# Patient Record
Sex: Female | Born: 1995 | Race: Black or African American | Hispanic: No | Marital: Single | Smoking: Current every day smoker
Health system: Southern US, Community
[De-identification: ages and names within clinical notes are randomized; demographics above are authoritative.]

## PROBLEM LIST (undated history)

## (undated) ENCOUNTER — Inpatient Hospital Stay (HOSPITAL_COMMUNITY): Payer: Self-pay

## (undated) ENCOUNTER — Emergency Department (HOSPITAL_COMMUNITY)

## (undated) DIAGNOSIS — Z789 Other specified health status: Secondary | ICD-10-CM

## (undated) DIAGNOSIS — L509 Urticaria, unspecified: Secondary | ICD-10-CM

## (undated) DIAGNOSIS — R519 Headache, unspecified: Secondary | ICD-10-CM

## (undated) DIAGNOSIS — R51 Headache: Secondary | ICD-10-CM

## (undated) HISTORY — DX: Urticaria, unspecified: L50.9

## (undated) HISTORY — PX: NO PAST SURGERIES: SHX2092

---
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
## 2014-02-06 ENCOUNTER — Ambulatory Visit (INDEPENDENT_AMBULATORY_CARE_PROVIDER_SITE_OTHER): Payer: Medicaid Other | Admitting: Obstetrics & Gynecology

## 2014-02-06 VITALS — BP 99/80 | HR 90 | Temp 98.5°F | Wt 158.0 lb

## 2014-02-06 DIAGNOSIS — IMO0002 Reserved for concepts with insufficient information to code with codable children: Secondary | ICD-10-CM

## 2014-02-06 LAB — POCT URINALYSIS DIP (DEVICE)
BILIRUBIN URINE: NEGATIVE
GLUCOSE, UA: NEGATIVE mg/dL
Ketones, ur: NEGATIVE mg/dL
NITRITE: NEGATIVE
Protein, ur: NEGATIVE mg/dL
Specific Gravity, Urine: 1.02 (ref 1.005–1.030)
UROBILINOGEN UA: 1 mg/dL (ref 0.0–1.0)
pH: 7 (ref 5.0–8.0)

## 2014-02-06 LAB — CBC
HCT: 29.6 % — ABNORMAL LOW (ref 36.0–46.0)
HEMOGLOBIN: 10.3 g/dL — AB (ref 12.0–15.0)
MCH: 30 pg (ref 26.0–34.0)
MCHC: 34.8 g/dL (ref 30.0–36.0)
MCV: 86.3 fL (ref 78.0–100.0)
PLATELETS: 244 10*3/uL (ref 150–400)
RBC: 3.43 MIL/uL — AB (ref 3.87–5.11)
RDW: 13.9 % (ref 11.5–15.5)
WBC: 8 10*3/uL (ref 4.0–10.5)

## 2014-02-06 LAB — RPR

## 2014-02-06 NOTE — Addendum Note (Signed)
Addended by: Sherre LainASH, Winifred Bodiford A on: 02/06/2014 12:30 PM   Modules accepted: Orders

## 2014-02-06 NOTE — Progress Notes (Signed)
Edema trace in feet. 1 hour GCT today.

## 2014-02-06 NOTE — Progress Notes (Signed)
No complaints, 1 hr GTT

## 2014-02-07 LAB — GLUCOSE TOLERANCE, 1 HOUR (50G) W/O FASTING: Glucose, 1 Hour GTT: 112 mg/dL (ref 70–140)

## 2014-02-07 LAB — HIV ANTIBODY (ROUTINE TESTING W REFLEX): HIV: NONREACTIVE

## 2014-02-27 ENCOUNTER — Ambulatory Visit (INDEPENDENT_AMBULATORY_CARE_PROVIDER_SITE_OTHER): Payer: Medicaid Other | Admitting: Physician Assistant

## 2014-02-27 VITALS — BP 117/98 | HR 83 | Temp 98.1°F | Wt 162.8 lb

## 2014-02-27 DIAGNOSIS — IMO0002 Reserved for concepts with insufficient information to code with codable children: Secondary | ICD-10-CM

## 2014-02-27 LAB — POCT URINALYSIS DIP (DEVICE)
BILIRUBIN URINE: NEGATIVE
GLUCOSE, UA: NEGATIVE mg/dL
HGB URINE DIPSTICK: NEGATIVE
Ketones, ur: NEGATIVE mg/dL
NITRITE: NEGATIVE
PH: 7 (ref 5.0–8.0)
Protein, ur: NEGATIVE mg/dL
Specific Gravity, Urine: 1.01 (ref 1.005–1.030)
UROBILINOGEN UA: 1 mg/dL (ref 0.0–1.0)

## 2014-02-27 NOTE — Progress Notes (Signed)
Edema-ankles  Pressure-lower abd

## 2014-02-27 NOTE — Progress Notes (Signed)
32 weeks, BP rechecked during visit and found to be 118/67 Cont PNV Tdap next visit.  Info given today.   Awaiting urine culture for large leukocytes in urine. RTC 2 weeks

## 2014-02-27 NOTE — Patient Instructions (Signed)
Third Trimester of Pregnancy The third trimester is from week 29 through week 42, months 7 through 9. The third trimester is a time when the fetus is growing rapidly. At the end of the ninth month, the fetus is about 20 inches in length and weighs 6-10 pounds.  BODY CHANGES Your body goes through many changes during pregnancy. The changes vary from woman to woman.   Your weight will continue to increase. You can expect to gain 25-35 pounds (11-16 kg) by the end of the pregnancy.  You may begin to get stretch marks on your hips, abdomen, and breasts.  You may urinate more often because the fetus is moving lower into your pelvis and pressing on your bladder.  You may develop or continue to have heartburn as a result of your pregnancy.  You may develop constipation because certain hormones are causing the muscles that push waste through your intestines to slow down.  You may develop hemorrhoids or swollen, bulging veins (varicose veins).  You may have pelvic pain because of the weight gain and pregnancy hormones relaxing your joints between the bones in your pelvis. Backaches may result from overexertion of the muscles supporting your posture.  You may have changes in your hair. These can include thickening of your hair, rapid growth, and changes in texture. Some women also have hair loss during or after pregnancy, or hair that feels dry or thin. Your hair will most likely return to normal after your baby is born.  Your breasts will continue to grow and be tender. A yellow discharge may leak from your breasts called colostrum.  Your belly button may stick out.  You may feel short of breath because of your expanding uterus.  You may notice the fetus "dropping," or moving lower in your abdomen.  You may have a bloody mucus discharge. This usually occurs a few days to a week before labor begins.  Your cervix becomes thin and soft (effaced) near your due date. WHAT TO EXPECT AT YOUR PRENATAL  EXAMS  You will have prenatal exams every 2 weeks until week 36. Then, you will have weekly prenatal exams. During a routine prenatal visit:  You will be weighed to make sure you and the fetus are growing normally.  Your blood pressure is taken.  Your abdomen will be measured to track your baby's growth.  The fetal heartbeat will be listened to.  Any test results from the previous visit will be discussed.  You may have a cervical check near your due date to see if you have effaced. At around 36 weeks, your caregiver will check your cervix. At the same time, your caregiver will also perform a test on the secretions of the vaginal tissue. This test is to determine if a type of bacteria, Group B streptococcus, is present. Your caregiver will explain this further. Your caregiver may ask you:  What your birth plan is.  How you are feeling.  If you are feeling the baby move.  If you have had any abnormal symptoms, such as leaking fluid, bleeding, severe headaches, or abdominal cramping.  If you have any questions. Other tests or screenings that may be performed during your third trimester include:  Blood tests that check for low iron levels (anemia).  Fetal testing to check the health, activity level, and growth of the fetus. Testing is done if you have certain medical conditions or if there are problems during the pregnancy. FALSE LABOR You may feel small, irregular contractions that   eventually go away. These are called Braxton Hicks contractions, or false labor. Contractions may last for hours, days, or even weeks before true labor sets in. If contractions come at regular intervals, intensify, or become painful, it is best to be seen by your caregiver.  SIGNS OF LABOR   Menstrual-like cramps.  Contractions that are 5 minutes apart or less.  Contractions that start on the top of the uterus and spread down to the lower abdomen and back.  A sense of increased pelvic pressure or back  pain.  A watery or bloody mucus discharge that comes from the vagina. If you have any of these signs before the 37th week of pregnancy, call your caregiver right away. You need to go to the hospital to get checked immediately. HOME CARE INSTRUCTIONS   Avoid all smoking, herbs, alcohol, and unprescribed drugs. These chemicals affect the formation and growth of the baby.  Follow your caregiver's instructions regarding medicine use. There are medicines that are either safe or unsafe to take during pregnancy.  Exercise only as directed by your caregiver. Experiencing uterine cramps is a good sign to stop exercising.  Continue to eat regular, healthy meals.  Wear a good support bra for breast tenderness.  Do not use hot tubs, steam rooms, or saunas.  Wear your seat belt at all times when driving.  Avoid raw meat, uncooked cheese, cat litter boxes, and soil used by cats. These carry germs that can cause birth defects in the baby.  Take your prenatal vitamins.  Try taking a stool softener (if your caregiver approves) if you develop constipation. Eat more high-fiber foods, such as fresh vegetables or fruit and whole grains. Drink plenty of fluids to keep your urine clear or pale yellow.  Take warm sitz baths to soothe any pain or discomfort caused by hemorrhoids. Use hemorrhoid cream if your caregiver approves.  If you develop varicose veins, wear support hose. Elevate your feet for 15 minutes, 3-4 times a day. Limit salt in your diet.  Avoid heavy lifting, wear low heal shoes, and practice good posture.  Rest a lot with your legs elevated if you have leg cramps or low back pain.  Visit your dentist if you have not gone during your pregnancy. Use a soft toothbrush to brush your teeth and be gentle when you floss.  A sexual relationship may be continued unless your caregiver directs you otherwise.  Do not travel far distances unless it is absolutely necessary and only with the approval  of your caregiver.  Take prenatal classes to understand, practice, and ask questions about the labor and delivery.  Make a trial run to the hospital.  Pack your hospital bag.  Prepare the baby's nursery.  Continue to go to all your prenatal visits as directed by your caregiver. SEEK MEDICAL CARE IF:  You are unsure if you are in labor or if your water has broken.  You have dizziness.  You have mild pelvic cramps, pelvic pressure, or nagging pain in your abdominal area.  You have persistent nausea, vomiting, or diarrhea.  You have a bad smelling vaginal discharge.  You have pain with urination. SEEK IMMEDIATE MEDICAL CARE IF:   You have a fever.  You are leaking fluid from your vagina.  You have spotting or bleeding from your vagina.  You have severe abdominal cramping or pain.  You have rapid weight loss or gain.  You have shortness of breath with chest pain.  You notice sudden or extreme swelling   of your face, hands, ankles, feet, or legs.  You have not felt your baby move in over an hour.  You have severe headaches that do not go away with medicine.  You have vision changes. Document Released: 06/20/2001 Document Revised: 07/01/2013 Document Reviewed: 08/27/2012 Exit[employer] Patient Information 2015 [employer]. This information is not intended to replace advice given to you by your health care provider. Make sure you discuss any questions you have with your health care provider.  

## 2014-03-01 LAB — URINE CULTURE: Colony Count: >=100000

## 2014-03-20 ENCOUNTER — Ambulatory Visit (INDEPENDENT_AMBULATORY_CARE_PROVIDER_SITE_OTHER): Payer: Medicaid Other | Admitting: Family Medicine

## 2014-03-20 VITALS — BP 127/73 | HR 92 | Temp 98.3°F | Wt 162.6 lb

## 2014-03-20 DIAGNOSIS — Z23 Encounter for immunization: Secondary | ICD-10-CM

## 2014-03-20 DIAGNOSIS — Z3480 Encounter for supervision of other normal pregnancy, unspecified trimester: Secondary | ICD-10-CM

## 2014-03-20 DIAGNOSIS — IMO0002 Reserved for concepts with insufficient information to code with codable children: Secondary | ICD-10-CM

## 2014-03-20 LAB — POCT URINALYSIS DIP (DEVICE)
BILIRUBIN URINE: NEGATIVE
Glucose, UA: NEGATIVE mg/dL
KETONES UR: NEGATIVE mg/dL
Nitrite: NEGATIVE
PH: 7.5 (ref 5.0–8.0)
PROTEIN: NEGATIVE mg/dL
Specific Gravity, Urine: 1.015 (ref 1.005–1.030)
Urobilinogen, UA: 2 mg/dL — ABNORMAL HIGH (ref 0.0–1.0)

## 2014-03-20 MED ORDER — TETANUS-DIPHTH-ACELL PERTUSSIS 5-2.5-18.5 LF-MCG/0.5 IM SUSP: 0.5000 mL | Freq: Once | INTRAMUSCULAR | Status: DC

## 2014-03-20 NOTE — Patient Instructions (Signed)
Third Trimester of Pregnancy The third trimester is from week 29 through week 42, months 7 through 9. The third trimester is a time when the fetus is growing rapidly. At the end of the ninth month, the fetus is about 20 inches in length and weighs 6-10 pounds.  BODY CHANGES Your body goes through many changes during pregnancy. The changes vary from woman to woman.   Your weight will continue to increase. You can expect to gain 25-35 pounds (11-16 kg) by the end of the pregnancy.  You may begin to get stretch marks on your hips, abdomen, and breasts.  You may urinate more often because the fetus is moving lower into your pelvis and pressing on your bladder.  You may develop or continue to have heartburn as a result of your pregnancy.  You may develop constipation because certain hormones are causing the muscles that push waste through your intestines to slow down.  You may develop hemorrhoids or swollen, bulging veins (varicose veins).  You may have pelvic pain because of the weight gain and pregnancy hormones relaxing your joints between the bones in your pelvis. Backaches may result from overexertion of the muscles supporting your posture.  You may have changes in your hair. These can include thickening of your hair, rapid growth, and changes in texture. Some women also have hair loss during or after pregnancy, or hair that feels dry or thin. Your hair will most likely return to normal after your baby is born.  Your breasts will continue to grow and be tender. A yellow discharge may leak from your breasts called colostrum.  Your belly button may stick out.  You may feel short of breath because of your expanding uterus.  You may notice the fetus "dropping," or moving lower in your abdomen.  You may have a bloody mucus discharge. This usually occurs a few days to a week before labor begins.  Your cervix becomes thin and soft (effaced) near your due date. WHAT TO EXPECT AT YOUR PRENATAL  EXAMS  You will have prenatal exams every 2 weeks until week 36. Then, you will have weekly prenatal exams. During a routine prenatal visit:  You will be weighed to make sure you and the fetus are growing normally.  Your blood pressure is taken.  Your abdomen will be measured to track your baby's growth.  The fetal heartbeat will be listened to.  Any test results from the previous visit will be discussed.  You may have a cervical check near your due date to see if you have effaced. At around 36 weeks, your caregiver will check your cervix. At the same time, your caregiver will also perform a test on the secretions of the vaginal tissue. This test is to determine if a type of bacteria, Group B streptococcus, is present. Your caregiver will explain this further. Your caregiver may ask you:  What your birth plan is.  How you are feeling.  If you are feeling the baby move.  If you have had any abnormal symptoms, such as leaking fluid, bleeding, severe headaches, or abdominal cramping.  If you have any questions. Other tests or screenings that may be performed during your third trimester include:  Blood tests that check for low iron levels (anemia).  Fetal testing to check the health, activity level, and growth of the fetus. Testing is done if you have certain medical conditions or if there are problems during the pregnancy. FALSE LABOR You may feel small, irregular contractions that   eventually go away. These are called Braxton Hicks contractions, or false labor. Contractions may last for hours, days, or even weeks before true labor sets in. If contractions come at regular intervals, intensify, or become painful, it is best to be seen by your caregiver.  SIGNS OF LABOR   Menstrual-like cramps.  Contractions that are 5 minutes apart or less.  Contractions that start on the top of the uterus and spread down to the lower abdomen and back.  A sense of increased pelvic pressure or back  pain.  A watery or bloody mucus discharge that comes from the vagina. If you have any of these signs before the 37th week of pregnancy, call your caregiver right away. You need to go to the hospital to get checked immediately. HOME CARE INSTRUCTIONS   Avoid all smoking, herbs, alcohol, and unprescribed drugs. These chemicals affect the formation and growth of the baby.  Follow your caregiver's instructions regarding medicine use. There are medicines that are either safe or unsafe to take during pregnancy.  Exercise only as directed by your caregiver. Experiencing uterine cramps is a good sign to stop exercising.  Continue to eat regular, healthy meals.  Wear a good support bra for breast tenderness.  Do not use hot tubs, steam rooms, or saunas.  Wear your seat belt at all times when driving.  Avoid raw meat, uncooked cheese, cat litter boxes, and soil used by cats. These carry germs that can cause birth defects in the baby.  Take your prenatal vitamins.  Try taking a stool softener (if your caregiver approves) if you develop constipation. Eat more high-fiber foods, such as fresh vegetables or fruit and whole grains. Drink plenty of fluids to keep your urine clear or pale yellow.  Take warm sitz baths to soothe any pain or discomfort caused by hemorrhoids. Use hemorrhoid cream if your caregiver approves.  If you develop varicose veins, wear support hose. Elevate your feet for 15 minutes, 3-4 times a day. Limit salt in your diet.  Avoid heavy lifting, wear low heal shoes, and practice good posture.  Rest a lot with your legs elevated if you have leg cramps or low back pain.  Visit your dentist if you have not gone during your pregnancy. Use a soft toothbrush to brush your teeth and be gentle when you floss.  A sexual relationship may be continued unless your caregiver directs you otherwise.  Do not travel far distances unless it is absolutely necessary and only with the approval  of your caregiver.  Take prenatal classes to understand, practice, and ask questions about the labor and delivery.  Make a trial run to the hospital.  Pack your hospital bag.  Prepare the baby's nursery.  Continue to go to all your prenatal visits as directed by your caregiver. SEEK MEDICAL CARE IF:  You are unsure if you are in labor or if your water has broken.  You have dizziness.  You have mild pelvic cramps, pelvic pressure, or nagging pain in your abdominal area.  You have persistent nausea, vomiting, or diarrhea.  You have a bad smelling vaginal discharge.  You have pain with urination. SEEK IMMEDIATE MEDICAL CARE IF:   You have a fever.  You are leaking fluid from your vagina.  You have spotting or bleeding from your vagina.  You have severe abdominal cramping or pain.  You have rapid weight loss or gain.  You have shortness of breath with chest pain.  You notice sudden or extreme swelling   of your face, hands, ankles, feet, or legs.  You have not felt your baby move in over an hour.  You have severe headaches that do not go away with medicine.  You have vision changes. Document Released: 06/20/2001 Document Revised: 07/01/2013 Document Reviewed: 08/27/2012 Exit[employer] Patient Information 2015 [employer]. This information is not intended to replace advice given to you by your health care provider. Make sure you discuss any questions you have with your health care provider.  

## 2014-03-20 NOTE — Progress Notes (Signed)
Patient without complaints.  Denies vaginal bleeding, abnormal vaginal discharge, contractions, loss of fluid.  Reports good fetal activity.  Follow up in 1 weeks.

## 2014-03-30 ENCOUNTER — Encounter: Payer: Self-pay | Admitting: Obstetrics and Gynecology

## 2014-03-30 ENCOUNTER — Other Ambulatory Visit: Payer: Self-pay | Admitting: Obstetrics and Gynecology

## 2014-03-30 ENCOUNTER — Ambulatory Visit (INDEPENDENT_AMBULATORY_CARE_PROVIDER_SITE_OTHER): Payer: Medicaid Other | Admitting: Obstetrics and Gynecology

## 2014-03-30 VITALS — BP 135/71 | HR 78 | Temp 97.9°F | Wt 166.6 lb

## 2014-03-30 DIAGNOSIS — IMO0002 Reserved for concepts with insufficient information to code with codable children: Secondary | ICD-10-CM

## 2014-03-30 DIAGNOSIS — Z3400 Encounter for supervision of normal first pregnancy, unspecified trimester: Secondary | ICD-10-CM

## 2014-03-30 DIAGNOSIS — A749 Chlamydial infection, unspecified: Secondary | ICD-10-CM

## 2014-03-30 LAB — POCT URINALYSIS DIP (DEVICE)
BILIRUBIN URINE: NEGATIVE
Glucose, UA: NEGATIVE mg/dL
HGB URINE DIPSTICK: NEGATIVE
KETONES UR: 40 mg/dL — AB
Nitrite: NEGATIVE
PH: 7.5 (ref 5.0–8.0)
PROTEIN: NEGATIVE mg/dL
SPECIFIC GRAVITY, URINE: 1.01 (ref 1.005–1.030)
Urobilinogen, UA: 1 mg/dL (ref 0.0–1.0)

## 2014-03-30 LAB — OB RESULTS CONSOLE GBS: STREP GROUP B AG: NEGATIVE

## 2014-03-30 LAB — OB RESULTS CONSOLE GC/CHLAMYDIA
CHLAMYDIA, DNA PROBE: POSITIVE
GC PROBE AMP, GENITAL: NEGATIVE

## 2014-03-30 NOTE — Progress Notes (Signed)
Doing well. Has good support. Cultures done. Pelvic pressure, RLP discussed. Advised classes and encouraged breastfeeding. Plans Depo.

## 2014-03-30 NOTE — Progress Notes (Signed)
Patient reports pelvic pressure.

## 2014-03-30 NOTE — Patient Instructions (Signed)
Third Trimester of Pregnancy The third trimester is from week 29 through week 42, months 7 through 9. The third trimester is a time when the fetus is growing rapidly. At the end of the ninth month, the fetus is about 20 inches in length and weighs 6-10 pounds.  BODY CHANGES Your body goes through many changes during pregnancy. The changes vary from woman to woman.   Your weight will continue to increase. You can expect to gain 25-35 pounds (11-16 kg) by the end of the pregnancy.  You may begin to get stretch marks on your hips, abdomen, and breasts.  You may urinate more often because the fetus is moving lower into your pelvis and pressing on your bladder.  You may develop or continue to have heartburn as a result of your pregnancy.  You may develop constipation because certain hormones are causing the muscles that push waste through your intestines to slow down.  You may develop hemorrhoids or swollen, bulging veins (varicose veins).  You may have pelvic pain because of the weight gain and pregnancy hormones relaxing your joints between the bones in your pelvis. Backaches may result from overexertion of the muscles supporting your posture.  You may have changes in your hair. These can include thickening of your hair, rapid growth, and changes in texture. Some women also have hair loss during or after pregnancy, or hair that feels dry or thin. Your hair will most likely return to normal after your baby is born.  Your breasts will continue to grow and be tender. A yellow discharge may leak from your breasts called colostrum.  Your belly button may stick out.  You may feel short of breath because of your expanding uterus.  You may notice the fetus "dropping," or moving lower in your abdomen.  You may have a bloody mucus discharge. This usually occurs a few days to a week before labor begins.  Your cervix becomes thin and soft (effaced) near your due date. WHAT TO EXPECT AT YOUR PRENATAL  EXAMS  You will have prenatal exams every 2 weeks until week 36. Then, you will have weekly prenatal exams. During a routine prenatal visit:  You will be weighed to make sure you and the fetus are growing normally.  Your blood pressure is taken.  Your abdomen will be measured to track your baby's growth.  The fetal heartbeat will be listened to.  Any test results from the previous visit will be discussed.  You may have a cervical check near your due date to see if you have effaced. At around 36 weeks, your caregiver will check your cervix. At the same time, your caregiver will also perform a test on the secretions of the vaginal tissue. This test is to determine if a type of bacteria, Group B streptococcus, is present. Your caregiver will explain this further. Your caregiver may ask you:  What your birth plan is.  How you are feeling.  If you are feeling the baby move.  If you have had any abnormal symptoms, such as leaking fluid, bleeding, severe headaches, or abdominal cramping.  If you have any questions. Other tests or screenings that may be performed during your third trimester include:  Blood tests that check for low iron levels (anemia).  Fetal testing to check the health, activity level, and growth of the fetus. Testing is done if you have certain medical conditions or if there are problems during the pregnancy. FALSE LABOR You may feel small, irregular contractions that   eventually go away. These are called Braxton Hicks contractions, or false labor. Contractions may last for hours, days, or even weeks before true labor sets in. If contractions come at regular intervals, intensify, or become painful, it is best to be seen by your caregiver.  SIGNS OF LABOR   Menstrual-like cramps.  Contractions that are 5 minutes apart or less.  Contractions that start on the top of the uterus and spread down to the lower abdomen and back.  A sense of increased pelvic pressure or back  pain.  A watery or bloody mucus discharge that comes from the vagina. If you have any of these signs before the 37th week of pregnancy, call your caregiver right away. You need to go to the hospital to get checked immediately. HOME CARE INSTRUCTIONS   Avoid all smoking, herbs, alcohol, and unprescribed drugs. These chemicals affect the formation and growth of the baby.  Follow your caregiver's instructions regarding medicine use. There are medicines that are either safe or unsafe to take during pregnancy.  Exercise only as directed by your caregiver. Experiencing uterine cramps is a good sign to stop exercising.  Continue to eat regular, healthy meals.  Wear a good support bra for breast tenderness.  Do not use hot tubs, steam rooms, or saunas.  Wear your seat belt at all times when driving.  Avoid raw meat, uncooked cheese, cat litter boxes, and soil used by cats. These carry germs that can cause birth defects in the baby.  Take your prenatal vitamins.  Try taking a stool softener (if your caregiver approves) if you develop constipation. Eat more high-fiber foods, such as fresh vegetables or fruit and whole grains. Drink plenty of fluids to keep your urine clear or pale yellow.  Take warm sitz baths to soothe any pain or discomfort caused by hemorrhoids. Use hemorrhoid cream if your caregiver approves.  If you develop varicose veins, wear support hose. Elevate your feet for 15 minutes, 3-4 times a day. Limit salt in your diet.  Avoid heavy lifting, wear low heal shoes, and practice good posture.  Rest a lot with your legs elevated if you have leg cramps or low back pain.  Visit your dentist if you have not gone during your pregnancy. Use a soft toothbrush to brush your teeth and be gentle when you floss.  A sexual relationship may be continued unless your caregiver directs you otherwise.  Do not travel far distances unless it is absolutely necessary and only with the approval  of your caregiver.  Take prenatal classes to understand, practice, and ask questions about the labor and delivery.  Make a trial run to the hospital.  Pack your hospital bag.  Prepare the baby's nursery.  Continue to go to all your prenatal visits as directed by your caregiver. SEEK MEDICAL CARE IF:  You are unsure if you are in labor or if your water has broken.  You have dizziness.  You have mild pelvic cramps, pelvic pressure, or nagging pain in your abdominal area.  You have persistent nausea, vomiting, or diarrhea.  You have a bad smelling vaginal discharge.  You have pain with urination. SEEK IMMEDIATE MEDICAL CARE IF:   You have a fever.  You are leaking fluid from your vagina.  You have spotting or bleeding from your vagina.  You have severe abdominal cramping or pain.  You have rapid weight loss or gain.  You have shortness of breath with chest pain.  You notice sudden or extreme swelling   of your face, hands, ankles, feet, or legs.  You have not felt your baby move in over an hour.  You have severe headaches that do not go away with medicine.  You have vision changes. Document Released: 06/20/2001 Document Revised: 07/01/2013 Document Reviewed: 08/27/2012 Exit[employer] Patient Information 2015 [employer]. This information is not intended to replace advice given to you by your health care provider. Make sure you discuss any questions you have with your health care provider.  

## 2014-03-31 ENCOUNTER — Telehealth: Payer: Self-pay | Admitting: *Deleted

## 2014-03-31 DIAGNOSIS — A749 Chlamydial infection, unspecified: Secondary | ICD-10-CM | POA: Insufficient documentation

## 2014-03-31 LAB — GC/CHLAMYDIA PROBE AMP
CT Probe RNA: POSITIVE — AB
GC Probe RNA: NEGATIVE

## 2014-03-31 MED ORDER — AZITHROMYCIN 250 MG PO TABS: ORAL_TABLET | ORAL | Status: DC

## 2014-03-31 NOTE — Telephone Encounter (Addendum)
Message copied by Jill Side on Tue Mar 31, 2014 10:49 AM ------      Message from: POE, DEIRDRE C      Created: Tue Mar 31, 2014 10:48 AM       Treat CT Azitho 1 gm po  ------ Called pt and her mother answered.  I asked to speak with Kesleigh.  She stated that Carmyn is not there right now. She wanted to take a message and to know why I was calling. I stated that I needed to speak to [facility] only. She will try to contact Javeah at a different number and have her call back.  STD form completed and faxed to [facility].  Pt returned my call minutes later. I informed her of test results and treatment prescribed. I also advised that her partner requires testing and treatment. She should not have intercourse until 2 weeks after her treatment. She also must wait to have intercourse with her partner until 2 weeks after his treatment. Pt voiced understanding of all information.

## 2014-04-02 LAB — CULTURE, BETA STREP (GROUP B ONLY)

## 2014-04-05 DIAGNOSIS — A749 Chlamydial infection, unspecified: Secondary | ICD-10-CM | POA: Insufficient documentation

## 2014-04-08 ENCOUNTER — Ambulatory Visit (INDEPENDENT_AMBULATORY_CARE_PROVIDER_SITE_OTHER): Payer: Medicaid Other | Admitting: Advanced Practice Midwife

## 2014-04-08 ENCOUNTER — Encounter: Payer: Self-pay | Admitting: Advanced Practice Midwife

## 2014-04-08 VITALS — BP 137/69 | HR 91 | Temp 98.0°F | Wt 164.1 lb

## 2014-04-08 DIAGNOSIS — O98819 Other maternal infectious and parasitic diseases complicating pregnancy, unspecified trimester: Secondary | ICD-10-CM

## 2014-04-08 DIAGNOSIS — A749 Chlamydial infection, unspecified: Secondary | ICD-10-CM

## 2014-04-08 DIAGNOSIS — IMO0002 Reserved for concepts with insufficient information to code with codable children: Secondary | ICD-10-CM

## 2014-04-08 LAB — POCT URINALYSIS DIP (DEVICE)
BILIRUBIN URINE: NEGATIVE
GLUCOSE, UA: NEGATIVE mg/dL
Hgb urine dipstick: NEGATIVE
KETONES UR: 80 mg/dL — AB
NITRITE: NEGATIVE
PH: 7 (ref 5.0–8.0)
Protein, ur: NEGATIVE mg/dL
Specific Gravity, Urine: 1.01 (ref 1.005–1.030)
Urobilinogen, UA: 1 mg/dL (ref 0.0–1.0)

## 2014-04-08 NOTE — Progress Notes (Signed)
Reviewed retreatment for chlamydia. TOC in 3 weeks. Labor precautions

## 2014-04-08 NOTE — Progress Notes (Signed)
Pt positive for chlamydia on cultures last week.  Pt states she took medication and her [facility] be [facility] stated he did not have chlamydia, but he did receive treatment.  Pt has a question concerning this.

## 2014-04-08 NOTE — Patient Instructions (Signed)

## 2014-04-15 ENCOUNTER — Ambulatory Visit (INDEPENDENT_AMBULATORY_CARE_PROVIDER_SITE_OTHER): Payer: Medicaid Other | Admitting: Physician Assistant

## 2014-04-15 VITALS — BP 133/81 | HR 88 | Wt 165.8 lb

## 2014-04-15 DIAGNOSIS — Z3403 Encounter for supervision of normal first pregnancy, third trimester: Secondary | ICD-10-CM

## 2014-04-15 LAB — POCT URINALYSIS DIP (DEVICE)
BILIRUBIN URINE: NEGATIVE
GLUCOSE, UA: NEGATIVE mg/dL
KETONES UR: NEGATIVE mg/dL
Nitrite: NEGATIVE
PROTEIN: NEGATIVE mg/dL
SPECIFIC GRAVITY, URINE: 1.01 (ref 1.005–1.030)
Urobilinogen, UA: 1 mg/dL (ref 0.0–1.0)
pH: 7 (ref 5.0–8.0)

## 2014-04-15 NOTE — Progress Notes (Signed)
38 weeks, irregular contractions.  Denies vag bleeding, LOF, dysuria.  Endorses good FM Cont PNV.  Labor Precautions discussed.  RTC 1 week.

## 2014-04-17 LAB — URINE CULTURE

## 2014-04-21 ENCOUNTER — Inpatient Hospital Stay (HOSPITAL_COMMUNITY)
Admission: AD | Admit: 2014-04-21 | Discharge: 2014-04-21 | Disposition: A | Discharge status: Home / Self Care | Payer: Medicaid Other | Source: Ambulatory Visit | Attending: Obstetrics & Gynecology | Admitting: Obstetrics & Gynecology

## 2014-04-21 ENCOUNTER — Encounter (HOSPITAL_COMMUNITY): Payer: Self-pay | Admitting: *Deleted

## 2014-04-21 DIAGNOSIS — Z3A39 39 weeks gestation of pregnancy: Secondary | ICD-10-CM | POA: Diagnosis not present

## 2014-04-21 DIAGNOSIS — O471 False labor at or after 37 completed weeks of gestation: Secondary | ICD-10-CM | POA: Diagnosis not present

## 2014-04-21 HISTORY — DX: Other specified health status: Z78.9

## 2014-04-21 NOTE — Discharge Instructions (Signed)
Fetal Movement Counts °Patient Name: __________________________________________________ Patient Due Date: ____________________ °Performing a fetal movement count is highly recommended in high-risk pregnancies, but it is good for every pregnant woman to do. Your health care provider may ask you to start counting fetal movements at 28 weeks of the pregnancy. Fetal movements often increase: °· After eating a full meal. °· After physical activity. °· After eating or drinking something sweet or cold. °· At rest. °Pay attention to when you feel the baby is most active. This will help you notice a pattern of your baby's sleep and wake cycles and what factors contribute to an increase in fetal movement. It is important to perform a fetal movement count at the same time each day when your baby is normally most active.  °HOW TO COUNT FETAL MOVEMENTS °1. Find a quiet and comfortable area to sit or lie down on your left side. Lying on your left side provides the best blood and oxygen circulation to your baby. °2. Write down the day and time on a sheet of paper or in a journal. °3. Start counting kicks, flutters, swishes, rolls, or jabs in a 2-hour period. You should feel at least 10 movements within 2 hours. °4. If you do not feel 10 movements in 2 hours, wait 2-3 hours and count again. Look for a change in the pattern or not enough counts in 2 hours. °SEEK MEDICAL CARE IF: °· You feel less than 10 counts in 2 hours, tried twice. °· There is no movement in over an hour. °· The pattern is changing or taking longer each day to reach 10 counts in 2 hours. °· You feel the baby is not moving as he or she usually does. °Date: ____________ Movements: ____________ Start time: ____________ Finish time: ____________  °Date: ____________ Movements: ____________ Start time: ____________ Finish time: ____________ °Date: ____________ Movements: ____________ Start time: ____________ Finish time: ____________ °Date: ____________ Movements:  ____________ Start time: ____________ Finish time: ____________ °Date: ____________ Movements: ____________ Start time: ____________ Finish time: ____________ °Date: ____________ Movements: ____________ Start time: ____________ Finish time: ____________ °Date: ____________ Movements: ____________ Start time: ____________ Finish time: ____________ °Date: ____________ Movements: ____________ Start time: ____________ Finish time: ____________  °Date: ____________ Movements: ____________ Start time: ____________ Finish time: ____________ °Date: ____________ Movements: ____________ Start time: ____________ Finish time: ____________ °Date: ____________ Movements: ____________ Start time: ____________ Finish time: ____________ °Date: ____________ Movements: ____________ Start time: ____________ Finish time: ____________ °Date: ____________ Movements: ____________ Start time: ____________ Finish time: ____________ °Date: ____________ Movements: ____________ Start time: ____________ Finish time: ____________ °Date: ____________ Movements: ____________ Start time: ____________ Finish time: ____________  °Date: ____________ Movements: ____________ Start time: ____________ Finish time: ____________ °Date: ____________ Movements: ____________ Start time: ____________ Finish time: ____________ °Date: ____________ Movements: ____________ Start time: ____________ Finish time: ____________ °Date: ____________ Movements: ____________ Start time: ____________ Finish time: ____________ °Date: ____________ Movements: ____________ Start time: ____________ Finish time: ____________ °Date: ____________ Movements: ____________ Start time: ____________ Finish time: ____________ °Date: ____________ Movements: ____________ Start time: ____________ Finish time: ____________  °Date: ____________ Movements: ____________ Start time: ____________ Finish time: ____________ °Date: ____________ Movements: ____________ Start time: ____________ Finish  time: ____________ °Date: ____________ Movements: ____________ Start time: ____________ Finish time: ____________ °Date: ____________ Movements: ____________ Start time: ____________ Finish time: ____________ °Date: ____________ Movements: ____________ Start time: ____________ Finish time: ____________ °Date: ____________ Movements: ____________ Start time: ____________ Finish time: ____________ °Date: ____________ Movements: ____________ Start time: ____________ Finish time: ____________  °Date: ____________ Movements: ____________ Start time: ____________ Finish   time: ____________ °Date: ____________ Movements: ____________ Start time: ____________ Finish time: ____________ °Date: ____________ Movements: ____________ Start time: ____________ Finish time: ____________ °Date: ____________ Movements: ____________ Start time: ____________ Finish time: ____________ °Date: ____________ Movements: ____________ Start time: ____________ Finish time: ____________ °Date: ____________ Movements: ____________ Start time: ____________ Finish time: ____________ °Date: ____________ Movements: ____________ Start time: ____________ Finish time: ____________  °Date: ____________ Movements: ____________ Start time: ____________ Finish time: ____________ °Date: ____________ Movements: ____________ Start time: ____________ Finish time: ____________ °Date: ____________ Movements: ____________ Start time: ____________ Finish time: ____________ °Date: ____________ Movements: ____________ Start time: ____________ Finish time: ____________ °Date: ____________ Movements: ____________ Start time: ____________ Finish time: ____________ °Date: ____________ Movements: ____________ Start time: ____________ Finish time: ____________ °Date: ____________ Movements: ____________ Start time: ____________ Finish time: ____________  °Date: ____________ Movements: ____________ Start time: ____________ Finish time: ____________ °Date: ____________  Movements: ____________ Start time: ____________ Finish time: ____________ °Date: ____________ Movements: ____________ Start time: ____________ Finish time: ____________ °Date: ____________ Movements: ____________ Start time: ____________ Finish time: ____________ °Date: ____________ Movements: ____________ Start time: ____________ Finish time: ____________ °Date: ____________ Movements: ____________ Start time: ____________ Finish time: ____________ °Date: ____________ Movements: ____________ Start time: ____________ Finish time: ____________  °Date: ____________ Movements: ____________ Start time: ____________ Finish time: ____________ °Date: ____________ Movements: ____________ Start time: ____________ Finish time: ____________ °Date: ____________ Movements: ____________ Start time: ____________ Finish time: ____________ °Date: ____________ Movements: ____________ Start time: ____________ Finish time: ____________ °Date: ____________ Movements: ____________ Start time: ____________ Finish time: ____________ °Date: ____________ Movements: ____________ Start time: ____________ Finish time: ____________ °Document Released: 07/26/2006 Document Revised: 11/10/2013 Document Reviewed: 04/22/2012 °[employer]® Patient Information ©2015 [employer]. This information is not intended to replace advice given to you by your health care provider. Make sure you discuss any questions you have with your health care provider. °Braxton Hicks Contractions °Contractions of the uterus can occur throughout pregnancy. Contractions are not always a sign that you are in labor.  °WHAT ARE BRAXTON HICKS CONTRACTIONS?  °Contractions that occur before labor are called Braxton Hicks contractions, or false labor. Toward the end of pregnancy (32-34 weeks), these contractions can develop more often and may become more forceful. This is not true labor because these contractions do not result in opening (dilatation) and thinning of the cervix. They  are sometimes difficult to tell apart from true labor because these contractions can be forceful and people have different pain tolerances. You should not feel embarrassed if you go to the hospital with false labor. Sometimes, the only way to tell if you are in true labor is for your health care provider to look for changes in the cervix. °If there are no prenatal problems or other health problems associated with the pregnancy, it is completely safe to be sent home with false labor and await the onset of true labor. °HOW CAN YOU TELL THE DIFFERENCE BETWEEN TRUE AND FALSE LABOR? °False Labor °· The contractions of false labor are usually shorter and not as hard as those of true labor.   °· The contractions are usually irregular.   °· The contractions are often felt in the front of the lower abdomen and in the groin.   °· The contractions may go away when you walk around or change positions while lying down.   °· The contractions get weaker and are shorter lasting as time goes on.   °· The contractions do not usually become progressively stronger, regular, and closer together as with true labor.   °True Labor °· Contractions in true labor last 30-70 seconds, become   very regular, usually become more intense, and increase in frequency.   °· The contractions do not go away with walking.   °· The discomfort is usually felt in the top of the uterus and spreads to the lower abdomen and low back.   °· True labor can be determined by your health care provider with an exam. This will show that the cervix is dilating and getting thinner.   °WHAT TO REMEMBER °· Keep up with your usual exercises and follow other instructions given by your health care provider.   °· Take medicines as directed by your health care provider.   °· Keep your regular prenatal appointments.   °· Eat and drink lightly if you think you are going into labor.   °· If Braxton Hicks contractions are making you uncomfortable:   °¨ Change your position from  lying down or resting to walking, or from walking to resting.   °¨ Sit and rest in a tub of warm water.   °¨ Drink 2-3 glasses of water. Dehydration may cause these contractions.   °¨ Do slow and deep breathing several times an hour.   °WHEN SHOULD I SEEK IMMEDIATE MEDICAL CARE? °Seek immediate medical care if: °· Your contractions become stronger, more regular, and closer together.   °· You have fluid leaking or gushing from your vagina.   °· You have a fever.   °· You pass blood-tinged mucus.   °· You have vaginal bleeding.   °· You have continuous abdominal pain.   °· You have low back pain that you never had before.   °· You feel your baby's head pushing down and causing pelvic pressure.   °· Your baby is not moving as much as it used to.   °Document Released: 06/26/2005 Document Revised: 07/01/2013 Document Reviewed: 04/07/2013 °[employer]® Patient Information ©2015 [employer]. This information is not intended to replace advice given to you by your health care provider. Make sure you discuss any questions you have with your health care provider. ° °

## 2014-04-21 NOTE — MAU Note (Signed)
Pt presents with complaint of contractions and pain in her back. Denies bleeding or ROM.

## 2014-04-21 NOTE — MAU Note (Signed)
Pt. Here for evaluation of contractions that she has been experiencing all day. Unsure of the frequency. Denies leakage of fluid or bleeding. Baby has been moving well. Last appointment was last week and did not have a SVE done at that time. Next appointment is scheduled for Thursday.

## 2014-04-22 ENCOUNTER — Inpatient Hospital Stay (HOSPITAL_COMMUNITY)
Admission: AD | Admit: 2014-04-22 | Discharge: 2014-04-22 | Disposition: A | Discharge status: Home / Self Care | Payer: Medicaid Other | Source: Ambulatory Visit | Attending: Obstetrics & Gynecology | Admitting: Obstetrics & Gynecology

## 2014-04-22 ENCOUNTER — Encounter (HOSPITAL_COMMUNITY): Payer: Self-pay | Admitting: *Deleted

## 2014-04-22 DIAGNOSIS — O471 False labor at or after 37 completed weeks of gestation: Secondary | ICD-10-CM | POA: Insufficient documentation

## 2014-04-22 DIAGNOSIS — Z3A39 39 weeks gestation of pregnancy: Secondary | ICD-10-CM | POA: Insufficient documentation

## 2014-04-22 DIAGNOSIS — O479 False labor, unspecified: Secondary | ICD-10-CM

## 2014-04-22 LAB — POCT FERN TEST: POCT Fern Test: NEGATIVE

## 2014-04-22 NOTE — Discharge Instructions (Signed)
Braxton Hicks Contractions °Contractions of the uterus can occur throughout pregnancy. Contractions are not always a sign that you are in labor.  °WHAT ARE BRAXTON HICKS CONTRACTIONS?  °Contractions that occur before labor are called Braxton Hicks contractions, or false labor. Toward the end of pregnancy (32-34 weeks), these contractions can develop more often and may become more forceful. This is not true labor because these contractions do not result in opening (dilatation) and thinning of the cervix. They are sometimes difficult to tell apart from true labor because these contractions can be forceful and people have different pain tolerances. You should not feel embarrassed if you go to the hospital with false labor. Sometimes, the only way to tell if you are in true labor is for your health care provider to look for changes in the cervix. °If there are no prenatal problems or other health problems associated with the pregnancy, it is completely safe to be sent home with false labor and await the onset of true labor. °HOW CAN YOU TELL THE DIFFERENCE BETWEEN TRUE AND FALSE LABOR? °False Labor °· The contractions of false labor are usually shorter and not as hard as those of true labor.   °· The contractions are usually irregular.   °· The contractions are often felt in the front of the lower abdomen and in the groin.   °· The contractions may go away when you walk around or change positions while lying down.   °· The contractions get weaker and are shorter lasting as time goes on.   °· The contractions do not usually become progressively stronger, regular, and closer together as with true labor.   °True Labor °· Contractions in true labor last 30-70 seconds, become very regular, usually become more intense, and increase in frequency.   °· The contractions do not go away with walking.   °· The discomfort is usually felt in the top of the uterus and spreads to the lower abdomen and low back.   °· True labor can be  determined by your health care provider with an exam. This will show that the cervix is dilating and getting thinner.   °WHAT TO REMEMBER °· Keep up with your usual exercises and follow other instructions given by your health care provider.   °· Take medicines as directed by your health care provider.   °· Keep your regular prenatal appointments.   °· Eat and drink lightly if you think you are going into labor.   °· If Braxton Hicks contractions are making you uncomfortable:   °¨ Change your position from lying down or resting to walking, or from walking to resting.   °¨ Sit and rest in a tub of warm water.   °¨ Drink 2-3 glasses of water. Dehydration may cause these contractions.   °¨ Do slow and deep breathing several times an hour.   °WHEN SHOULD I SEEK IMMEDIATE MEDICAL CARE? °Seek immediate medical care if: °· Your contractions become stronger, more regular, and closer together.   °· You have fluid leaking or gushing from your vagina.   °· You have a fever.   °· You pass blood-tinged mucus.   °· You have vaginal bleeding.   °· You have continuous abdominal pain.   °· You have low back pain that you never had before.   °· You feel your baby's head pushing down and causing pelvic pressure.   °· Your baby is not moving as much as it used to.   °Document Released: 06/26/2005 Document Revised: 07/01/2013 Document Reviewed: 04/07/2013 °Exit[employer]® Patient Information ©2015 [employer]. This information is not intended to replace advice given to you by your health care   provider. Make sure you discuss any questions you have with your health care provider. ° °

## 2014-04-22 NOTE — MAU Provider Note (Signed)
  History     CSN: [id number]  Arrival date and time: 04/22/14 1429   None     Chief Complaint  Patient presents with  . Contractions   HPI Jill Thomas 17 y.o. G1P0000 @[redacted]w[redacted]d  presents to MAU with contractions that have not stopped since yesterday.  She also believes her water broke.  Last night she saw wet spots in her underwear.  This morning the same thing happened.  Endorses good fetal movement.  Is having contractions but denies vaginal bleeding, dysuria. OB History   Grav Para Term Preterm Abortions TAB SAB Ect Mult Living   1 0 0 0 0 0 0 0 0 0       Past Medical History  Diagnosis Date  . Medical history non-contributory     Past Surgical History  Procedure Laterality Date  . No past surgeries      Family History  Problem Relation Age of Onset  . Hypertension Maternal Grandmother   . Cancer Maternal Grandfather     History  Substance Use Topics  . Smoking status: Never Smoker   . Smokeless tobacco: Never Used  . Alcohol Use: No    Allergies: No Known Allergies  Prescriptions prior to admission  Medication Sig Dispense Refill  . Prenatal Multivit-Min-Fe-FA (PRENATAL VITAMINS) 0.8 MG tablet Take 1 tablet by mouth daily.  30 tablet  12    Review of Systems  Constitutional: Negative for fever, chills and diaphoresis.  Respiratory: Positive for shortness of breath.   Cardiovascular: Negative for chest pain and palpitations.  Gastrointestinal: Positive for abdominal pain. Negative for nausea and vomiting.  Genitourinary: Positive for frequency. Negative for dysuria.  Neurological: Negative for weakness.  Psychiatric/Behavioral: Negative for depression, suicidal ideas and substance abuse. The patient is not nervous/anxious.    Physical Exam   Blood pressure 128/80, pulse 78, temperature 98 F (36.7 C), temperature source Oral, resp. rate 20, last menstrual period 07/24/2013.  Physical Exam  Constitutional: She is oriented to person, place, and  time. She appears well-developed and well-nourished. No distress.  HENT:  Head: Normocephalic and atraumatic.  Eyes: EOM are normal.  Neck: Normal range of motion.  Cardiovascular: Normal rate, regular rhythm and normal heart sounds.   Respiratory: Effort normal and breath sounds normal. No respiratory distress.  GI: Soft. Bowel sounds are normal. She exhibits distension.  Genitourinary:  No pooling of fluid.   No ferning noted on microscopic eval Cervix is 1-2cm and thick.    Musculoskeletal: Normal range of motion.  Neurological: She is alert and oriented to person, place, and time.  Skin: Skin is warm and dry.  Psychiatric: She has a normal mood and affect.  Fetal Tracing:  Baseline:130 Variability:mod Accelerations: 15x15 Decelerations:none  Toco:irregular q 2-8 minutes    MAU Course  Procedures  MDM Discussed with Caren Griffinseirdre Poe, CNM.  Pt to be discharged to home.    Assessment and Plan  A: Braxton-Hicks contractions, intact membranes  P: Discharge to home Labor precautions: return to MAU for regular contractions q 5 minutes x 1 hour- gush or leaking of fluid, vaginal bleeding Keep clinic appointments  Teague Edwena BlowClark, Karen E 04/22/2014, 4:38 PM

## 2014-04-22 NOTE — MAU Note (Signed)
Pt C/O uc's since Saturday, was seen in MAU last night - DC'd home.  Pt states uc's are stronger at the bottom of her abdomen today.  Denies bleeding, states she lost her mucus plug today, ? Leaking since yesterday, underwear feels wet constantly.

## 2014-04-23 ENCOUNTER — Encounter (HOSPITAL_COMMUNITY): Payer: Self-pay | Admitting: *Deleted

## 2014-04-23 ENCOUNTER — Ambulatory Visit (INDEPENDENT_AMBULATORY_CARE_PROVIDER_SITE_OTHER): Payer: Medicaid Other | Admitting: Physician Assistant

## 2014-04-23 ENCOUNTER — Inpatient Hospital Stay (HOSPITAL_COMMUNITY)
Admission: AD | Admit: 2014-04-23 | Discharge: 2014-04-23 | Disposition: A | Discharge status: Home / Self Care | Payer: Medicaid Other | Source: Ambulatory Visit | Attending: Obstetrics & Gynecology | Admitting: Obstetrics & Gynecology

## 2014-04-23 VITALS — BP 137/84 | HR 86 | Wt 165.2 lb

## 2014-04-23 DIAGNOSIS — Z3A39 39 weeks gestation of pregnancy: Secondary | ICD-10-CM | POA: Diagnosis not present

## 2014-04-23 DIAGNOSIS — Z3403 Encounter for supervision of normal first pregnancy, third trimester: Secondary | ICD-10-CM

## 2014-04-23 DIAGNOSIS — O479 False labor, unspecified: Secondary | ICD-10-CM

## 2014-04-23 DIAGNOSIS — O471 False labor at or after 37 completed weeks of gestation: Secondary | ICD-10-CM | POA: Diagnosis present

## 2014-04-23 LAB — POCT URINALYSIS DIP (DEVICE)
Bilirubin Urine: NEGATIVE
GLUCOSE, UA: NEGATIVE mg/dL
Ketones, ur: 40 mg/dL — AB
NITRITE: NEGATIVE
PH: 7.5 (ref 5.0–8.0)
Protein, ur: NEGATIVE mg/dL
Specific Gravity, Urine: 1.015 (ref 1.005–1.030)
UROBILINOGEN UA: 1 mg/dL (ref 0.0–1.0)

## 2014-04-23 MED ORDER — MORPHINE SULFATE 4 MG/ML IJ SOLN
8.0000 mg | Freq: Once | INTRAMUSCULAR | Status: AC
  Administered 2014-04-23: 4 mg via INTRAMUSCULAR
  Filled 2014-04-23: qty 2

## 2014-04-23 NOTE — Discharge Instructions (Signed)
Braxton Hicks Contractions °Contractions of the uterus can occur throughout pregnancy. Contractions are not always a sign that you are in labor.  °WHAT ARE BRAXTON HICKS CONTRACTIONS?  °Contractions that occur before labor are called Braxton Hicks contractions, or false labor. Toward the end of pregnancy (32-34 weeks), these contractions can develop more often and may become more forceful. This is not true labor because these contractions do not result in opening (dilatation) and thinning of the cervix. They are sometimes difficult to tell apart from true labor because these contractions can be forceful and people have different pain tolerances. You should not feel embarrassed if you go to the hospital with false labor. Sometimes, the only way to tell if you are in true labor is for your health care provider to look for changes in the cervix. °If there are no prenatal problems or other health problems associated with the pregnancy, it is completely safe to be sent home with false labor and await the onset of true labor. °HOW CAN YOU TELL THE DIFFERENCE BETWEEN TRUE AND FALSE LABOR? °False Labor °· The contractions of false labor are usually shorter and not as hard as those of true labor.   °· The contractions are usually irregular.   °· The contractions are often felt in the front of the lower abdomen and in the groin.   °· The contractions may go away when you walk around or change positions while lying down.   °· The contractions get weaker and are shorter lasting as time goes on.   °· The contractions do not usually become progressively stronger, regular, and closer together as with true labor.   °True Labor °· Contractions in true labor last 30-70 seconds, become very regular, usually become more intense, and increase in frequency.   °· The contractions do not go away with walking.   °· The discomfort is usually felt in the top of the uterus and spreads to the lower abdomen and low back.   °· True labor can be  determined by your health care provider with an exam. This will show that the cervix is dilating and getting thinner.   °WHAT TO REMEMBER °· Keep up with your usual exercises and follow other instructions given by your health care provider.   °· Take medicines as directed by your health care provider.   °· Keep your regular prenatal appointments.   °· Eat and drink lightly if you think you are going into labor.   °· If Braxton Hicks contractions are making you uncomfortable:   °¨ Change your position from lying down or resting to walking, or from walking to resting.   °¨ Sit and rest in a tub of warm water.   °¨ Drink 2-3 glasses of water. Dehydration may cause these contractions.   °¨ Do slow and deep breathing several times an hour.   °WHEN SHOULD I SEEK IMMEDIATE MEDICAL CARE? °Seek immediate medical care if: °· Your contractions become stronger, more regular, and closer together.   °· You have fluid leaking or gushing from your vagina.   °· You have a fever.   °· You pass blood-tinged mucus.   °· You have vaginal bleeding.   °· You have continuous abdominal pain.   °· You have low back pain that you never had before.   °· You feel your baby's head pushing down and causing pelvic pressure.   °· Your baby is not moving as much as it used to.   °Document Released: 06/26/2005 Document Revised: 07/01/2013 Document Reviewed: 04/07/2013 °Exit[employer]® Patient Information ©2015 [employer]. This information is not intended to replace advice given to you by your health care   provider. Make sure you discuss any questions you have with your health care provider. ° °

## 2014-04-23 NOTE — MAU Provider Note (Signed)
Attestation of Attending Supervision of Advanced Practitioner (CNM/NP): Evaluation and management procedures were performed by the Advanced Practitioner under my supervision and collaboration. I have reviewed the Advanced Practitioner's note and chart, and I agree with the management and plan.  Tiffini Blacksher H. 10:16 PM

## 2014-04-23 NOTE — MAU Provider Note (Signed)
First Provider Initiated Contact with Patient 04/23/14 2153      Chief Complaint:  Labor Eval   Jill Thomas is  18 y.o. G1P0000 at 3434w6d presents complaining of Labor Eval . She has been here every day since Tuesday c/o contractions q 2-3 minutes. Denies ROM, vaginal bleeding  Obstetrical/Gynecological History: OB History   Grav Para Term Preterm Abortions TAB SAB Ect Mult Living   1 0 0 0 0 0 0 0 0 0      Past Medical History: Past Medical History  Diagnosis Date  . Medical history non-contributory     Past Surgical History: Past Surgical History  Procedure Laterality Date  . No past surgeries      Family History: Family History  Problem Relation Age of Onset  . Hypertension Maternal Grandmother   . Cancer Maternal Grandfather     Social History: History  Substance Use Topics  . Smoking status: Never Smoker   . Smokeless tobacco: Never Used  . Alcohol Use: No    Allergies: No Known Allergies  Meds:  Prescriptions prior to admission  Medication Sig Dispense Refill  . acetaminophen (TYLENOL) 325 MG tablet Take 325 mg by mouth every 6 (six) hours as needed for headache.      . Prenatal Multivit-Min-Fe-FA (PRENATAL VITAMINS) 0.8 MG tablet Take 1 tablet by mouth daily.  30 tablet  12    Review of Systems   Constitutional: Negative for fever and chills Eyes: Negative for visual disturbances Respiratory: Negative for shortness of breath, dyspnea Cardiovascular: Negative for chest pain or palpitations  Gastrointestinal: Negative for vomiting, diarrhea and constipation Genitourinary: Negative for dysuria and urgency Musculoskeletal: Negative for back pain, joint pain, myalgias  Neurological: Negative for dizziness and headaches     Physical Exam  Blood pressure 128/75, pulse 77, temperature 98.3 F (36.8 C), temperature source Oral, height 5' (1.524 m), weight 75.978 kg (167 lb 8 oz), last menstrual period 07/24/2013. GENERAL: Well-developed,  well-nourished female in no acute distress.  LUNGS: Clear to auscultation bilaterally.  HEART: Regular rate and rhythm. ABDOMEN: Soft, nontender, nondistended, gravid.  EXTREMITIES: Nontender, no edema, 2+ distal pulses. DTR's 2+ CERVICAL EXAM: Dilatation 2cm   Effacement 70%   Station -3 (has been 1-2/60/-3 all week)   Presentation: cephalic FHT:  Baseline rate 140 bpm   Variability moderate  Accelerations present   Decelerations none Contractions: Every 2-3 mins, mild   Labs: Results for orders placed in visit on 04/23/14 (from the past 24 hour(s))  POCT URINALYSIS DIP (DEVICE)   Collection Time    04/23/14  3:47 PM      Result Value Ref Range   Glucose, UA NEGATIVE  NEGATIVE mg/dL   Bilirubin Urine NEGATIVE  NEGATIVE   Ketones, ur 40 (*) NEGATIVE mg/dL   Specific Gravity, Urine 1.015  1.005 - 1.030   Hgb urine dipstick MODERATE (*) NEGATIVE   pH 7.5  5.0 - 8.0   Protein, ur NEGATIVE  NEGATIVE mg/dL   Urobilinogen, UA 1.0  0.0 - 1.0 mg/dL   Nitrite NEGATIVE  NEGATIVE   Leukocytes, UA MODERATE (*) NEGATIVE   Imaging Studies:  No results found.  Assessment: Jill Thomas is  18 y.o. G1P0000 at 8534w6d presents with false vs early labor.  Plan: Therapeutic rest offered and accepted.  F/U if ctx increase in strength  CRESENZO-DISHMAN,Dawan Farney 10/15/20159:56 PM

## 2014-04-23 NOTE — Progress Notes (Signed)
Patient prefers to only take half the pain med dose ordered, states she doesn't want to be too sleepy.  Pt's significant other is driving her home.

## 2014-04-23 NOTE — Progress Notes (Signed)
Has been to MAU twice this week for contractions and thought she was in labor. Was dilated 1.5cm

## 2014-04-23 NOTE — MAU Note (Signed)
Pt. Has been having uc's since Tuesday, contractions are worse today. Came in for a labor check.

## 2014-04-24 ENCOUNTER — Encounter (HOSPITAL_COMMUNITY): Payer: Self-pay

## 2014-04-24 ENCOUNTER — Encounter (HOSPITAL_COMMUNITY): Payer: Medicaid Other | Admitting: Anesthesiology

## 2014-04-24 ENCOUNTER — Inpatient Hospital Stay (HOSPITAL_COMMUNITY): Payer: Medicaid Other | Admitting: Anesthesiology

## 2014-04-24 ENCOUNTER — Inpatient Hospital Stay (HOSPITAL_COMMUNITY)
Admission: AD | Admit: 2014-04-24 | Discharge: 2014-04-25 | DRG: 775 | Disposition: A | Discharge status: Home / Self Care | Payer: Medicaid Other | Source: Ambulatory Visit | Attending: Obstetrics & Gynecology | Admitting: Obstetrics & Gynecology

## 2014-04-24 DIAGNOSIS — Z3A40 40 weeks gestation of pregnancy: Secondary | ICD-10-CM | POA: Diagnosis present

## 2014-04-24 DIAGNOSIS — Z8249 Family history of ischemic heart disease and other diseases of the circulatory system: Secondary | ICD-10-CM

## 2014-04-24 DIAGNOSIS — IMO0001 Reserved for inherently not codable concepts without codable children: Secondary | ICD-10-CM

## 2014-04-24 DIAGNOSIS — O471 False labor at or after 37 completed weeks of gestation: Secondary | ICD-10-CM | POA: Diagnosis present

## 2014-04-24 LAB — CBC
HCT: 31.3 % — ABNORMAL LOW (ref 36.0–46.0)
Hemoglobin: 10.5 g/dL — ABNORMAL LOW (ref 12.0–15.0)
MCH: 27.7 pg (ref 26.0–34.0)
MCHC: 33.5 g/dL (ref 30.0–36.0)
MCV: 82.6 fL (ref 78.0–100.0)
PLATELETS: 231 10*3/uL (ref 150–400)
RBC: 3.79 MIL/uL — AB (ref 3.87–5.11)
RDW: 14.5 % (ref 11.5–15.5)
WBC: 12.2 10*3/uL — AB (ref 4.0–10.5)

## 2014-04-24 LAB — COMPREHENSIVE METABOLIC PANEL WITH GFR
ALT: 9 U/L (ref 0–35)
AST: 15 U/L (ref 0–37)
Albumin: 3.2 g/dL — ABNORMAL LOW (ref 3.5–5.2)
Alkaline Phosphatase: 334 U/L — ABNORMAL HIGH (ref 39–117)
Anion gap: 15 (ref 5–15)
BUN: 3 mg/dL — ABNORMAL LOW (ref 6–23)
CO2: 21 meq/L (ref 19–32)
Calcium: 9.2 mg/dL (ref 8.4–10.5)
Chloride: 101 meq/L (ref 96–112)
Creatinine, Ser: 0.41 mg/dL — ABNORMAL LOW (ref 0.50–1.10)
GFR calc Af Amer: >90 mL/min
GFR calc non Af Amer: >90 mL/min
Glucose, Bld: 109 mg/dL — ABNORMAL HIGH (ref 70–99)
Potassium: 3.2 meq/L — ABNORMAL LOW (ref 3.7–5.3)
Sodium: 137 meq/L (ref 137–147)
Total Bilirubin: 0.2 mg/dL — ABNORMAL LOW (ref 0.3–1.2)
Total Protein: 6.9 g/dL (ref 6.0–8.3)

## 2014-04-24 LAB — TYPE AND SCREEN
ABO/RH(D): A POS
ANTIBODY SCREEN: NEGATIVE

## 2014-04-24 LAB — PROTEIN / CREATININE RATIO, URINE
Creatinine, Urine: 102.84 mg/dL
Protein Creatinine Ratio: 0.29 — ABNORMAL HIGH (ref 0.00–0.15)
Total Protein, Urine: 30.3 mg/dL

## 2014-04-24 LAB — RPR

## 2014-04-24 LAB — HIV ANTIBODY (ROUTINE TESTING W REFLEX): HIV 1&2 Ab, 4th Generation: NONREACTIVE

## 2014-04-24 LAB — ABO/RH: ABO/RH(D): A POS

## 2014-04-24 MED ORDER — CITRIC ACID-SODIUM CITRATE 334-500 MG/5ML PO SOLN: 30.0000 mL | ORAL | Status: DC | PRN

## 2014-04-24 MED ORDER — PHENYLEPHRINE 40 MCG/ML (10ML) SYRINGE FOR IV PUSH (FOR BLOOD PRESSURE SUPPORT)
80.0000 ug | PREFILLED_SYRINGE | INTRAVENOUS | Status: DC | PRN
  Filled 2014-04-24: qty 10
  Filled 2014-04-24: qty 2

## 2014-04-24 MED ORDER — OXYTOCIN BOLUS FROM INFUSION
500.0000 mL | INTRAVENOUS | Status: DC
  Administered 2014-04-24: 500 mL via INTRAVENOUS

## 2014-04-24 MED ORDER — LIDOCAINE HCL (PF) 1 % IJ SOLN
30.0000 mL | INTRAMUSCULAR | Status: DC | PRN
  Filled 2014-04-24: qty 30

## 2014-04-24 MED ORDER — TETANUS-DIPHTH-ACELL PERTUSSIS 5-2.5-18.5 LF-MCG/0.5 IM SUSP: 0.5000 mL | Freq: Once | INTRAMUSCULAR | Status: DC

## 2014-04-24 MED ORDER — OXYCODONE-ACETAMINOPHEN 5-325 MG PO TABS: 2.0000 | ORAL_TABLET | ORAL | Status: DC | PRN

## 2014-04-24 MED ORDER — SIMETHICONE 80 MG PO CHEW: 80.0000 mg | CHEWABLE_TABLET | ORAL | Status: DC | PRN

## 2014-04-24 MED ORDER — OXYTOCIN 40 UNITS IN LACTATED RINGERS INFUSION - SIMPLE MED
62.5000 mL/h | INTRAVENOUS | Status: DC
  Filled 2014-04-24: qty 1000

## 2014-04-24 MED ORDER — SENNOSIDES-DOCUSATE SODIUM 8.6-50 MG PO TABS: 2.0000 | ORAL_TABLET | ORAL | Status: DC

## 2014-04-24 MED ORDER — FLEET ENEMA 7-19 GM/118ML RE ENEM: 1.0000 | ENEMA | RECTAL | Status: DC | PRN

## 2014-04-24 MED ORDER — DIPHENHYDRAMINE HCL 25 MG PO CAPS: 25.0000 mg | ORAL_CAPSULE | Freq: Four times a day (QID) | ORAL | Status: DC | PRN

## 2014-04-24 MED ORDER — ONDANSETRON HCL 4 MG/2ML IJ SOLN: 4.0000 mg | Freq: Four times a day (QID) | INTRAMUSCULAR | Status: DC | PRN

## 2014-04-24 MED ORDER — BENZOCAINE-MENTHOL 20-0.5 % EX AERO: 1.0000 "application " | INHALATION_SPRAY | CUTANEOUS | Status: DC | PRN

## 2014-04-24 MED ORDER — OXYCODONE-ACETAMINOPHEN 5-325 MG PO TABS: 1.0000 | ORAL_TABLET | ORAL | Status: DC | PRN

## 2014-04-24 MED ORDER — ONDANSETRON HCL 4 MG/2ML IJ SOLN: 4.0000 mg | INTRAMUSCULAR | Status: DC | PRN

## 2014-04-24 MED ORDER — ACETAMINOPHEN 325 MG PO TABS: 650.0000 mg | ORAL_TABLET | ORAL | Status: DC | PRN

## 2014-04-24 MED ORDER — LACTATED RINGERS IV SOLN
INTRAVENOUS | Status: DC
  Administered 2014-04-24 (×2): via INTRAVENOUS

## 2014-04-24 MED ORDER — ONDANSETRON HCL 4 MG PO TABS: 4.0000 mg | ORAL_TABLET | ORAL | Status: DC | PRN

## 2014-04-24 MED ORDER — LANOLIN HYDROUS EX OINT: TOPICAL_OINTMENT | CUTANEOUS | Status: DC | PRN

## 2014-04-24 MED ORDER — EPHEDRINE 5 MG/ML INJ
10.0000 mg | INTRAVENOUS | Status: DC | PRN
  Filled 2014-04-24: qty 2

## 2014-04-24 MED ORDER — ZOLPIDEM TARTRATE 5 MG PO TABS: 5.0000 mg | ORAL_TABLET | Freq: Every evening | ORAL | Status: DC | PRN

## 2014-04-24 MED ORDER — PRENATAL MULTIVITAMIN CH
1.0000 | ORAL_TABLET | Freq: Every day | ORAL | Status: DC
  Administered 2014-04-25: 1 via ORAL
  Filled 2014-04-24: qty 1

## 2014-04-24 MED ORDER — DIPHENHYDRAMINE HCL 50 MG/ML IJ SOLN: 12.5000 mg | INTRAMUSCULAR | Status: DC | PRN

## 2014-04-24 MED ORDER — FENTANYL CITRATE 0.05 MG/ML IJ SOLN: 50.0000 ug | INTRAMUSCULAR | Status: DC | PRN

## 2014-04-24 MED ORDER — WITCH HAZEL-GLYCERIN EX PADS: 1.0000 "application " | MEDICATED_PAD | CUTANEOUS | Status: DC | PRN

## 2014-04-24 MED ORDER — LACTATED RINGERS IV SOLN: 500.0000 mL | INTRAVENOUS | Status: DC | PRN

## 2014-04-24 MED ORDER — DIBUCAINE 1 % RE OINT: 1.0000 "application " | TOPICAL_OINTMENT | RECTAL | Status: DC | PRN

## 2014-04-24 MED ORDER — FENTANYL 2.5 MCG/ML BUPIVACAINE 1/10 % EPIDURAL INFUSION (WH - ANES)
14.0000 mL/h | INTRAMUSCULAR | Status: DC | PRN
  Administered 2014-04-24: 14 mL/h via EPIDURAL
  Filled 2014-04-24: qty 125

## 2014-04-24 MED ORDER — IBUPROFEN 600 MG PO TABS
600.0000 mg | ORAL_TABLET | Freq: Four times a day (QID) | ORAL | Status: DC
  Administered 2014-04-24 – 2014-04-25 (×4): 600 mg via ORAL
  Filled 2014-04-24 (×4): qty 1

## 2014-04-24 MED ORDER — FENTANYL 2.5 MCG/ML BUPIVACAINE 1/10 % EPIDURAL INFUSION (WH - ANES)
INTRAMUSCULAR | Status: DC | PRN
  Administered 2014-04-24: 14 mL/h via EPIDURAL

## 2014-04-24 MED ORDER — LIDOCAINE HCL (PF) 1 % IJ SOLN
INTRAMUSCULAR | Status: DC | PRN
  Administered 2014-04-24 (×2): 8 mL

## 2014-04-24 MED ORDER — LACTATED RINGERS IV SOLN: 500.0000 mL | Freq: Once | INTRAVENOUS | Status: DC

## 2014-04-24 NOTE — Anesthesia Procedure Notes (Signed)
Epidural Patient location during procedure: OB Start time: 04/24/2014 8:52 AM End time: 04/24/2014 8:56 AM  Staffing Anesthesiologist: Leilani AbleHATCHETT, FRANKLIN Performed by: anesthesiologist   Preanesthetic Checklist Completed: patient identified, surgical consent, pre-op evaluation, timeout performed, IV checked, risks and benefits discussed and monitors and equipment checked  Epidural Patient position: sitting Prep: site prepped and draped and DuraPrep Patient monitoring: continuous pulse ox and blood pressure Approach: midline Location: L3-L4 Injection technique: LOR air  Needle:  Needle type: Tuohy  Needle gauge: 17 G Needle length: 9 cm and 9 Needle insertion depth: 6 cm Catheter type: closed end flexible Catheter size: 19 Gauge Catheter at skin depth: 11 cm Test dose: negative and Other  Assessment Sensory level: T9 Events: blood not aspirated, injection not painful, no injection resistance, negative IV test and no paresthesia  Additional Notes Reason for block:procedure for pain

## 2014-04-24 NOTE — Anesthesia Preprocedure Evaluation (Signed)

## 2014-04-24 NOTE — H&P (Signed)
Jill Thomas is a 18 y.o. female G1P0000 with IUP at 19103w0d presenting for contractions.  She has been to the MAU every day since Tuesday with cx essentially unchanged at 2/60/-3.  Membranes are intact, with active fetal movement.   [facility]. Denies HA, blurred vision, RUQ pain  Prenatal History/Complications: none  Past Medical History: Past Medical History  Diagnosis Date  . Medical history non-contributory     Past Surgical History: Past Surgical History  Procedure Laterality Date  . No past surgeries      Obstetrical History: OB History   Grav Para Term Preterm Abortions TAB SAB Ect Mult Living   1 0 0 0 0 0 0 0 0 0       Social History: History   Social History  . Marital Status: Single    Spouse Name: N/A    Number of Children: N/A  . Years of Education: N/A   Social History Main Topics  . Smoking status: Never Smoker   . Smokeless tobacco: Never Used  . Alcohol Use: No  . Drug Use: No  . Sexual Activity: Yes    Birth Control/ Protection: None   Other Topics Concern  . Not on file   Social History Narrative  . No narrative on file    Family History: Family History  Problem Relation Age of Onset  . Hypertension Maternal Grandmother   . Cancer Maternal Grandfather     Allergies: No Known Allergies  Prescriptions prior to admission  Medication Sig Dispense Refill  . acetaminophen (TYLENOL) 325 MG tablet Take 325 mg by mouth every 6 (six) hours as needed for headache.      . Prenatal Multivit-Min-Fe-FA (PRENATAL VITAMINS) 0.8 MG tablet Take 1 tablet by mouth daily.  30 tablet  12     Prenatal Transfer Tool  Maternal Diabetes: No Genetic Screening: Normal Maternal Ultrasounds/Referrals: Normal Fetal Ultrasounds or other Referrals:  None Maternal Substance Abuse:  No Significant Maternal Medications:  None Significant Maternal Lab Results: None   Review of Systems   Constitutional: Negative for fever and  chills Eyes: Negative for visual disturbances Respiratory: Negative for shortness of breath, dyspnea Cardiovascular: Negative for chest pain or palpitations  Gastrointestinal: Negative for vomiting, diarrhea and constipation Genitourinary: Negative for dysuria and urgency Musculoskeletal: Negative for back pain, joint pain, myalgias  Neurological: Negative for dizziness and headaches     Blood pressure 146/86, pulse 80, height 5' (1.524 m), weight 74.934 kg (165 lb 3.2 oz), last menstrual period 07/24/2013. General appearance: alert, cooperative and mild distress Lungs: clear to auscultation bilaterally Heart: regular rate and rhythm Abdomen: soft, non-tender; bowel sounds normal Extremities: Homans sign is negative, no sign of DVT DTR's 2+ Presentation: cephalic Fetal monitoringBaseline: 140 bpm, Variability: Good {> 6 bpm), Accelerations: Reactive and Decelerations: Absent Uterine activity   Moderate ctx q 3-5 mintues Dilation: 3.5 Effacement (%): 90 Station: -1 Exam by:: Judeth HornErin Lawrence RNC  132/77 BP recheck   Prenatal labs: ABO, Rh: A/POS/-- (03/03 1121) Antibody: NEG (03/03 1121) Rubella:   RPR: NON REAC (07/31 1233)  HBsAg: NEGATIVE (03/03 1121)  HIV: NONREACTIVE (07/31 1233)  GBS: Negative (09/21 0000)  1 hr Glucola 112 Genetic screening  Normal quad Anatomy US normal   Results for orders placed in visit on 04/23/14 (from the past 24 hour(s))  POCT URINALYSIS DIP (DEVICE)   Collection Time    04/23/14  3:47 PM      Result Value Ref Range  Glucose, UA NEGATIVE  NEGATIVE mg/dL   Bilirubin Urine NEGATIVE  NEGATIVE   Ketones, ur 40 (*) NEGATIVE mg/dL   Specific Gravity, Urine 1.015  1.005 - 1.030   Hgb urine dipstick MODERATE (*) NEGATIVE   pH 7.5  5.0 - 8.0   Protein, ur NEGATIVE  NEGATIVE mg/dL   Urobilinogen, UA 1.0  0.0 - 1.0 mg/dL   Nitrite NEGATIVE  NEGATIVE   Leukocytes, UA MODERATE (*) NEGATIVE    Assessment: [name] is a 18 y.o.  G1P0000 with an IUP at 424w0d presenting for early labor  Plan: #Labor:  Admit d/t finally having cx change, although ctx aren't as frequent as during her previous visits.  BP up a little, so not much to be gained by observing for active labor, and will augment prn. Will check preeclampsia labs, keep an eye on BP.  #Pain:  epidural #FWB cat 1 #ID: GBS: neg  #MOF:  breast #MOC: depo #Circ: outpt   CRESENZO-DISHMAN,Asianae Minkler 04/24/2014, 6:39 AM

## 2014-04-24 NOTE — Progress Notes (Signed)
Attestation of Attending Supervision of Advanced Practitioner (PA/CNM/NP): Evaluation and management procedures were performed by the Advanced Practitioner under my supervision and collaboration.  I have reviewed the Advanced Practitioner's note and chart, and I agree with the management and plan.  Tecia Cinnamon, MD, FACOG Attending Obstetrician & Gynecologist Faculty Practice, [facility]   

## 2014-04-24 NOTE — Progress Notes (Signed)
Jill Thomas is an 18 y.o. G1P0000 at 80110w0d by ultrasound admitted for active labor  Subjective: Pt is currently feeling well. Feels slight pressure during contractions. Pain well controlled.  Objective: BP 134/76  Pulse 119  Temp(Src) 98.6 F (37 C) (Oral)  Resp 20  Ht 5' (1.524 m)  Wt 74.934 kg (165 lb 3.2 oz)  BMI 32.26 kg/m2  SpO2 100%  LMP 07/24/2013      FHT:  FHR: 130 bpm, variability: minimal ,  accelerations:  Present,  decelerations:  Absent UC:   regular, every 4-5 minutes SVE:   Dilation: 10 Effacement (%): 100 Station: +2 Exam by:: Raliegh Ipatherine Stout RN and Dr. Alycia RossettiKoch  Labs: Lab Results  Component Value Date   WBC 12.2* 04/24/2014   HGB 10.5* 04/24/2014   HCT 31.3* 04/24/2014   MCV 82.6 04/24/2014   PLT 231 04/24/2014    Assessment / Plan: Augmentation of labor, progressing well  Labor: Progressing well. Preeclampsia:  no signs or symptoms of toxicity Fetal Wellbeing:  Category I Pain Control:  Epidural I/D:  n/a Anticipated MOD:  NSVD  Lanice Folden 04/24/2014, 3:10 PM

## 2014-04-24 NOTE — MAU Provider Note (Signed)

## 2014-04-24 NOTE — Progress Notes (Signed)
Jill Thomas is an 18 y.o. G1P0000 at 8636w0d by ultrasound admitted for active labor  Subjective: Pt is currently doing well. Has epidural in place. Pain is well controlled.   Objective: BP 125/84  Pulse 97  Temp(Src) 98.3 F (36.8 C) (Oral)  Resp 20  Ht 5' (1.524 m)  Wt 74.934 kg (165 lb 3.2 oz)  BMI 32.26 kg/m2  SpO2 100%  LMP 07/24/2013      FHT:  FHR: 130 bpm, variability: minimal ,  accelerations:  Present,  decelerations:  Absent UC:   regular, every 4-5 minutes SVE:   Dilation: 8 Effacement (%): 100 Station: 0 Exam by:: Pincus BadderK. Chadwin Fury CNM  Labs: Lab Results  Component Value Date   WBC 12.2* 04/24/2014   HGB 10.5* 04/24/2014   HCT 31.3* 04/24/2014   MCV 82.6 04/24/2014   PLT 231 04/24/2014    Assessment / Plan: Augmentation of labor, progressing well  Labor: Progressing on Pitocin, will continue to increase then AROM Preeclampsia:  no signs or symptoms of toxicity Fetal Wellbeing:  Category I Pain Control:  Epidural I/D:  n/a Anticipated MOD:  NSVD  Jill Thomas 04/24/2014, 1:42 PM

## 2014-04-25 MED ORDER — IBUPROFEN 600 MG PO TABS: 600.0000 mg | ORAL_TABLET | Freq: Four times a day (QID) | ORAL | Status: DC

## 2014-04-25 NOTE — Discharge Summary (Signed)
Attestation of Attending Supervision of Advanced Practitioner (PA/CNM/NP): Evaluation and management procedures were performed by the Advanced Practitioner under my supervision and collaboration.  I have reviewed the Advanced Practitioner's note and chart, and I agree with the management and plan.  Zelia Yzaguirre, DO Attending Physician Faculty Practice, [facility]  

## 2014-04-25 NOTE — Anesthesia Postprocedure Evaluation (Signed)
  Anesthesia Post-op Note  Patient: Jill Thomas  Procedure(s) Performed: * No procedures listed *  Patient Location: Mother/Baby  Anesthesia Type:Epidural  Level of Consciousness: awake  Airway and Oxygen Therapy: Patient Spontanous Breathing  Post-op Pain: mild  Post-op Assessment: Patient's Cardiovascular Status Stable and Respiratory Function Stable  Post-op Vital Signs: stable  Last Vitals:  Filed Vitals:   04/25/14 0559  BP: 125/87  Pulse: 75  Temp: 36.8 C  Resp: 18    Complications: No apparent anesthesia complications

## 2014-04-25 NOTE — Discharge Summary (Signed)
Obstetric Discharge Summary Reason for Admission: onset of labor Prenatal Procedures: none Intrapartum Procedures: spontaneous vaginal delivery Postpartum Procedures: none Complications-Operative and Postpartum: none  Hospital Course:  Patient admitted with contractions and cervical change. Progressed well on pitocin with no complications.   Delivery Note At 4:25 PM a viable female was delivered via (Presentation: OA Vertex ). APGAR: 8,9 ; weight pending.  Placenta status: spont, intact . Cord: 3 vessels. Cord pH: Not done  Anesthesia: Epidural  Episiotomy: No  Lacerations: None  Suture Repair: None  Est. Blood Loss (mL): 350ml  Mom to postpartum. Baby to Couplet care / Skin to Skin.  Cam HaiSHAW, KIMBERLY CNM  04/24/2014, 4:47 PM  Postpartum, the patient did well with no complications.  On the day of discharge,  Pt denied problems with ambulating, voiding or po intake.  She denied nausea or vomiting.  Pain wass well controlled.  She has had flatus. She has not had bowel movement.  Lochia Small.     H/H: Lab Results  Component Value Date/Time   HGB 10.5* 04/24/2014  7:45 AM   HCT 31.3* 04/24/2014  7:45 AM    Filed Vitals:   04/25/14 0559  BP: 125/87  Pulse: 75  Temp: 98.2 F (36.8 C)  Resp: 18    Physical Exam: VSS NAD Abd: Appropriately tender, ND, Fundus firm No c/c/e, Neg homan's sign, neg cords Lochia Appropriate  Discharge Diagnoses: Term Pregnancy-delivered  Discharge Information: Date: 04/25/2014 Activity: pelvic rest Diet: routine  Medications: PNV, Tylenol #3 and Ibuprofen Breast feeding:  No: bottle Condition: stable Instructions: refer to handout Discharge to: home      Medication List         acetaminophen 500 MG tablet  Commonly known as:  TYLENOL  Take 500 mg by mouth every 6 (six) hours as needed.     ibuprofen 600 MG tablet  Commonly known as:  ADVIL,MOTRIN  Take 1 tablet (600 mg total) by mouth every 6 (six) hours.     Prenatal  Vitamins 0.8 MG tablet  Take 1 tablet by mouth daily.       Follow-up Information   [facility] Schedule an appointment as soon as possible for a visit in 6 weeks. (for post partum follow up)    Specialty:  Obstetrics and Gynecology   Contact information:   [address] [place] [address] [phone]      Jacquiline Doearker, Caleb 04/25/2014,9:04 AM  I have seen and examined this patient and I agree with the above. Pt will pursue an inpt circ under [employer] insurance possibly, but was given outpt locations and contact info if needed. Cam HaiSHAW, KIMBERLY CNM 9:07 AM 04/25/2014

## 2014-04-25 NOTE — Discharge Instructions (Signed)

## 2014-04-27 NOTE — Progress Notes (Signed)
Ur chart review completed post discharge.  

## 2014-04-28 ENCOUNTER — Other Ambulatory Visit: Payer: Medicaid Other

## 2014-04-28 NOTE — H&P (Signed)
Attestation of Attending Supervision of Advanced Practitioner (PA/CNM/NP): Evaluation and management procedures were performed by the Advanced Practitioner under my supervision and collaboration.  I have reviewed the Advanced Practitioner's note and chart, and I agree with the management and plan.  Aurore Redinger, MD, FACOG Attending Obstetrician & Gynecologist Faculty Practice, [facility]   

## 2014-05-11 ENCOUNTER — Encounter (HOSPITAL_COMMUNITY): Payer: Self-pay

## 2014-05-22 ENCOUNTER — Ambulatory Visit: Payer: Medicaid Other | Admitting: Nurse Practitioner

## 2014-05-22 ENCOUNTER — Encounter: Payer: Self-pay | Admitting: Nurse Practitioner

## 2014-06-03 NOTE — Progress Notes (Signed)
Wrong pt encounter opened

## 2014-06-29 ENCOUNTER — Ambulatory Visit (INDEPENDENT_AMBULATORY_CARE_PROVIDER_SITE_OTHER): Payer: Medicaid Other | Admitting: *Deleted

## 2014-06-29 DIAGNOSIS — Z111 Encounter for screening for respiratory tuberculosis: Secondary | ICD-10-CM

## 2014-06-29 MED ORDER — TUBERCULIN PPD 5 UNIT/0.1ML ID SOLN
5.0000 [IU] | Freq: Once | INTRADERMAL | Status: AC
  Administered 2014-06-29: 5 [IU] via INTRADERMAL

## 2014-06-29 NOTE — Progress Notes (Signed)
Pt in clinic for TB skin test.  TB skin test on Right forearm, instructed to come back to clinic on Wednesday 07/01/14 so TB skin test can be read and letter given for work. Pt verbalizes understanding.

## 2014-07-01 ENCOUNTER — Ambulatory Visit: Payer: Medicaid Other | Admitting: *Deleted

## 2014-07-01 ENCOUNTER — Encounter: Payer: Self-pay | Admitting: *Deleted

## 2014-07-01 DIAGNOSIS — Z111 Encounter for screening for respiratory tuberculosis: Secondary | ICD-10-CM

## 2014-07-01 NOTE — Progress Notes (Signed)
Jill Thomas came  In to have tb test read. Results negative.  Letter given for work.

## 2017-03-28 ENCOUNTER — Encounter (HOSPITAL_COMMUNITY): Payer: Self-pay | Admitting: Emergency Medicine

## 2017-03-28 ENCOUNTER — Ambulatory Visit (HOSPITAL_COMMUNITY)
Admission: EM | Admit: 2017-03-28 | Discharge: 2017-03-28 | Disposition: A | Discharge status: Home / Self Care | Payer: Self-pay | Attending: Family Medicine | Admitting: Family Medicine

## 2017-03-28 DIAGNOSIS — H1031 Unspecified acute conjunctivitis, right eye: Secondary | ICD-10-CM

## 2017-03-28 MED ORDER — SULFACETAMIDE SODIUM 10 % OP SOLN: 1.0000 [drp] | Freq: Four times a day (QID) | OPHTHALMIC | 0 refills | Status: DC

## 2017-03-28 NOTE — ED Triage Notes (Signed)
The patient presented to the [facility] with a complaint of bilateral eye itching and pain x 3 days.

## 2017-03-31 NOTE — ED Provider Notes (Signed)
  [facility]   [id number] 03/28/17 Arrival Time: 1848  ASSESSMENT & PLAN:  1. Acute conjunctivitis of right eye, unspecified acute conjunctivitis type     Meds ordered this encounter  Medications  . sulfacetamide (BLEPH-10) 10 % ophthalmic solution    Sig: Place 1 drop into the right eye 4 (four) times daily.    Dispense:  5 mL    Refill:  0   F/U as needed. Reviewed expectations re: course of current medical issues. Questions answered. Outlined signs and symptoms indicating need for more acute intervention. Patient verbalized understanding. After Visit Summary given.   SUBJECTIVE:  Jill Thomas is a 21 y.o. female who presents with complaint of bilateral eye irritation/itching for 2-3 days, Fairly abrupt onset. Drainage noted; more in the morning. Does not wear contact lenses. Afebrile. Mild cold symptoms recently. No specific eye pain or visual changes reported. No OTC treatment.  ROS: As per HPI.   OBJECTIVE:  Vitals:   03/28/17 1952  BP: 109/63  Pulse: 79  Resp: 18  Temp: 98.4 F (36.9 C)  TempSrc: Oral  SpO2: 100%    General appearance: alert; no distress Eyes: PERRLA; EOMI; R eye with conjunctival injection and watery drainage HENT: normocephalic; atraumatic; TMs normal; nasal mucosa normal; oral mucosa normal Neck: supple Lungs: clear to auscultation bilaterally Skin: warm and dry Psychological: alert and cooperative; normal mood and affect  No Known Allergies  Past Medical History:  Diagnosis Date  . Medical history non-contributory    Social History   Social History  . Marital status: Single    Spouse name: N/A  . Number of children: N/A  . Years of education: N/A   Occupational History  . Not on file.   Social History Main Topics  . Smoking status: Never Smoker  . Smokeless tobacco: Never Used  . Alcohol use No  . Drug use: No  . Sexual activity: Yes    Birth control/ protection: None   Other Topics Concern  . Not  on file   Social History Narrative  . No narrative on file   Family History  Problem Relation Age of Onset  . Hypertension Maternal Grandmother   . Cancer Maternal Grandfather    Past Surgical History:  Procedure Laterality Date  . NO PAST SURGERIES       Mardella Layman, MD 03/31/17 1131

## 2017-06-25 ENCOUNTER — Ambulatory Visit: Payer: Medicaid Other

## 2017-06-25 ENCOUNTER — Encounter: Payer: Self-pay | Admitting: General Practice

## 2017-06-25 DIAGNOSIS — Z3201 Encounter for pregnancy test, result positive: Secondary | ICD-10-CM

## 2017-06-25 LAB — POCT PREGNANCY, URINE: Preg Test, Ur: POSITIVE — AB

## 2017-06-25 NOTE — Progress Notes (Signed)
Pt here today for pregnancy test.  Resulted positive.  Pt reports exact LMP 05/12/17.  EDD 02/16/18  6w 2d today.  Pt denies any complaints.  OB otc medication list given.  Pt's allergies and medications verified.  Proof of pregnancy letter provided to the pt.

## 2017-07-06 ENCOUNTER — Telehealth: Payer: Self-pay | Admitting: General Practice

## 2017-07-06 DIAGNOSIS — O219 Vomiting of pregnancy, unspecified: Secondary | ICD-10-CM

## 2017-07-06 MED ORDER — PROMETHAZINE HCL 12.5 MG PO TABS: 12.5000 mg | ORAL_TABLET | Freq: Four times a day (QID) | ORAL | 0 refills | Status: DC | PRN

## 2017-07-06 NOTE — Telephone Encounter (Signed)
Patient called and left message on nurse line wanting to know what OTC medication she can get for nausea. Per Dr Macon LargeAnyanwu, may give Rx for phenergan. Called and informed patient. Also discussed trying vitamin b6 100mg  BID, ginger tea, and eating small frequent meals. Patient verbalized understanding to all & had no questions

## 2017-08-06 ENCOUNTER — Other Ambulatory Visit (HOSPITAL_COMMUNITY)
Admission: RE | Admit: 2017-08-06 | Discharge: 2017-08-06 | Disposition: A | Discharge status: Home / Self Care | Payer: Medicaid Other | Source: Ambulatory Visit | Attending: Advanced Practice Midwife | Admitting: Advanced Practice Midwife

## 2017-08-06 ENCOUNTER — Ambulatory Visit (INDEPENDENT_AMBULATORY_CARE_PROVIDER_SITE_OTHER): Payer: Self-pay | Admitting: Advanced Practice Midwife

## 2017-08-06 ENCOUNTER — Encounter: Payer: Self-pay | Admitting: Advanced Practice Midwife

## 2017-08-06 DIAGNOSIS — Z3480 Encounter for supervision of other normal pregnancy, unspecified trimester: Secondary | ICD-10-CM

## 2017-08-06 DIAGNOSIS — Z3481 Encounter for supervision of other normal pregnancy, first trimester: Secondary | ICD-10-CM | POA: Insufficient documentation

## 2017-08-06 DIAGNOSIS — Z3482 Encounter for supervision of other normal pregnancy, second trimester: Secondary | ICD-10-CM

## 2017-08-06 MED ORDER — PRENATAL VITAMINS 0.8 MG PO TABS: 1.0000 | ORAL_TABLET | Freq: Every day | ORAL | 12 refills | Status: DC

## 2017-08-06 NOTE — Patient Instructions (Signed)
AREA PEDIATRIC/FAMILY PRACTICE PHYSICIANS  [facility] [address], [address] [place], [place]  [address] Phone - [phone]   Fax - [phone]  [facility] [address] [place], [place] [address] Phone - [phone]   Fax - [phone]  [facility] [address] [place], [place]  [address] Phone - [phone]   Fax - [phone]  [facility] [address] [place], [place]  [address] Phone - [phone]   Fax - [phone]   [facility] [address] [place], [place]  [address] Phone - [phone]   Fax - [phone]  [facility] [address] [place], [place]  [address] Phone - [phone]   Fax - [phone]  [facility] [address] [place], [place]  [address] Phone - [phone]   Fax - [phone]  [facility] [address] [place], [place]  [address] Phone - [phone]   Fax - [phone]  [facility] [address] [place], [place]  [address] Phone - [phone]   Fax - [phone] [facility] [address] [place], [place]  [address] Phone - [phone]   Fax - [phone]  [facility] [address] [place], [place]  [address] Phone - [phone]   Fax - [phone]  [facility] [address] [place], [place]  [address] Phone - [phone]   Fax - [phone]  [facility] [address] [place], [place]  [address] Phone - [phone]   Fax - [phone]  [facility]IL[facility] GOSRANI [address] [place], [place]  [address] Phone - [phone]  [facility] [address] [place], [place]  [address] Phone - [phone]   Fax - [phone]  [facility] [address] [place], [place]  [address] Phone - [phone]   Fax - [phone]  [facility] [address] [place], [place]  [address] Phone - [phone]   Fax - [phone]  [facility] [address] [place], [place]  [address] Phone - [phone]   Fax - [phone]   [facility] [address] [place], [place]  [address] Phone - [phone]   Fax - [phone]  [facility] [address] , [place]  [address] Phone - [phone]   Fax - [phone]  DAVID RUBIN [address] , [place]  [address] Phone - [phone]   Fax - [phone]  [facility] [address] , [place]  [address] Phone - [phone]   Fax - [phone]  [facility] [address] , [place]  [address] Phone - [phone]   Fax - [phone] [facility] [address] [place], [place]  [address] Phone - [phone]   Fax - [phone]  [facility] [address] [place], [place]  [address] Phone - [phone]   Fax - [phone]  [facility] [address] , [place]  [address] Phone - [phone]   Fax - [phone]  [facility] Charlene Flemming MD [address] [place] [address] Phone [phone]  Fax [phone]  Childbirth Education Options: [facility] Classes:  Childbirth education classes can help you   get ready for a positive parenting experience. You can also meet other expectant parents and get free stuff for your baby. Each class runs for five weeks on the same night and costs $45 for the mother-to-be and her support person. Medicaid covers the cost if you are eligible. Call [phone] to register. [facility] Childbirth Education:  [phone] or [phone] or  [email or web address]  Baby & Me Class: Discuss newborn & infant parenting and family adjustment issues with other new mothers in a relaxed environment. Each week brings a new speaker or baby-centered activity. We encourage new mothers to join us every Thursday at 11:00am. Babies birth until crawling. No registration or fee. Daddy Boot Camp: This course offers Dads-to-be the tools and knowledge needed to feel confident on their journey to becoming new fathers. Experienced dads, who have been trained as coaches, teach dads-to-be how to hold, comfort, diaper, swaddle and play with their infant while being able to support the new mom as well. A class for men taught by men. $25/dad Big Brother/Big Sister: Let your children share in the joy of a new brother or sister in this special class designed just for them. Class includes discussion about how families care for babies: swaddling, holding, diapering, safety as well as how they can be helpful in their new role. This class is designed for children ages 2 to 6, but any age is welcome. Please register each child individually. $5/child  Mom Talk: This mom-led group offers support and connection to mothers as they journey through the adjustments and struggles of that sometimes overwhelming first year after the birth of a child. Tuesdays at 10:00am and Thursdays at 6:00pm. Babies welcome. No registration or fee. Breastfeeding Support Group: This group is a mother-to-mother support circle where moms have the opportunity to share their breastfeeding experiences. A Lactation Consultant is present for questions and concerns. Meets each Tuesday at 11:00am. No fee or registration. Breastfeeding Your Baby: Learn what to expect in the first days of breastfeeding your newborn.  This class will help you feel more confident with the skills needed to begin your breastfeeding experience. Many new mothers are concerned about breastfeeding after leaving the hospital. This class  will also address the most common fears and challenges about breastfeeding during the first few weeks, months and beyond. (call for fee) Comfort Techniques and Tour: This 2 hour interactive class will provide you the opportunity to learn & practice hands-on techniques that can help relieve some of the discomfort of labor and encourage your baby to rotate toward the best position for birth. You and your partner will be able to try a variety of labor positions with birth balls and rebozos as well as practice breathing, relaxation, and visualization techniques. A tour of the [facility] is included with this class. $20 per registrant and support person Childbirth Class- Weekend Option: This class is a Weekend version of our Birth & Baby series. It is designed for parents who have a difficult time fitting several weeks of classes into their schedule. It covers the care of your newborn and the basics of labor and childbirth. It also includes a Maternity Care Center Tour of [facility] and lunch. The class is held two consecutive days: beginning on Friday evening from 6:30 - 8:30 p.m. and the next day, Saturday from 9 a.m. - 4 p.m. (call for fee) Waterbirth Class: Interested in a waterbirth?  This informational class will help you discover whether waterbirth is the right fit for you.   Education about waterbirth itself, supplies you would need and how to assemble your support team is what you can expect from this class. Some obstetrical practices require this class in order to pursue a waterbirth. (Not all obstetrical practices offer waterbirth-check with your healthcare provider.) Register only the expectant mom, but you are encouraged to bring your partner to class! Required if planning waterbirth, no fee. Infant/Child CPR: Parents, grandparents, babysitters, and friends learn Cardio-Pulmonary Resuscitation skills for infants and children. You will also learn how to treat both conscious  and unconscious choking in infants and children. This Family & Friends program does not offer certification. Register each participant individually to ensure that enough mannequins are available. (Call for fee) Grandparent Love: Expecting a grandbaby? This class is for you! Learn about the latest infant care and safety recommendations and ways to support your own child as he or she transitions into the parenting role. Taught by Registered Nurses who are childbirth instructors, but most importantly...they are grandmothers too! $10/person. Childbirth Class- Natural Childbirth: This series of 5 weekly classes is for expectant parents who want to learn and practice natural methods of coping with the process of labor and childbirth. Relaxation, breathing, massage, visualization, role of the partner, and helpful positioning are highlighted. Participants learn how to be confident in their body's ability to give birth. This class will empower and help parents make informed decisions about their own care. Includes discussion that will help new parents transition into the immediate postpartum period. [facility] is included. We suggest taking this class between 25-32 weeks, but it's only a recommendation. $75 per registrant and one support person or $30 Medicaid. Childbirth Class- 3 week Series: This option of 3 weekly classes helps you and your labor partner prepare for childbirth. Newborn care, labor & birth, cesarean birth, pain management, and comfort techniques are discussed and a [place] [facility] is included. The class meets at the same time, on the same day of the week for 3 consecutive weeks beginning with the starting date you choose. $60 for registrant and one support person.  Marvelous Multiples: Expecting twins, triplets, or more? This class covers the differences in labor, birth, parenting, and breastfeeding issues that face multiples' parents.  NICU tour is included. Led by a Certified Childbirth Educator who is the mother of twins. No fee. Caring for Baby: This class is for expectant and adoptive parents who want to learn and practice the most up-to-date newborn care for their babies. Focus is on birth through the first six weeks of life. Topics include feeding, bathing, diapering, crying, umbilical cord care, circumcision care and safe sleep. Parents learn to recognize symptoms of illness and when to call the pediatrician. Register only the mom-to-be and your partner or support person can plan to come with you! $10 per registrant and support person Childbirth Class- online option: This online class offers you the freedom to complete a Birth and Baby series in the comfort of your own home. The flexibility of this option allows you to review sections at your own pace, at times convenient to you and your support people. It includes additional video information, animations, quizzes, and extended activities. Get organized with helpful eClass tools, checklists, and trackers. Once you register online for the class, you will receive an email within a few days to accept the invitation and begin the class when the time is right for you. The content will be available to you for 60 days. $  60 for 60 days of online access for you and your support people.  Local Doulas: Natural Baby Doulas [email or web address] Tel: [phone] [email or web address] [facility] [phone] [email or web address] The Labor Ladies  (also do waterbirth tub rental) [phone] [email or web address] [facility] [phone] [email or web address] Sacred [employer]hythms  [phone] [email or web address] [place] [facility] [employer] (PADA) [email or web address] http://www.padanc.org/index.htm [place] [facility]   [email or web address] Considering Waterbirth? Guide for patients at [facility]  Why consider waterbirth?  . Gentle birth for babies . Less pain medicine used in labor . May allow for passive descent/less pushing . May reduce perineal tears  . More mobility and instinctive maternal position changes . Increased maternal relaxation . Reduced blood pressure in labor  Is waterbirth safe? What are the risks of infection, drowning or other complications?  . Infection: o Very low risk (3.7 % for tub vs 4.8% for bed) o 7 in 8000 waterbirths with documented infection o Poorly cleaned equipment most common cause o Slightly lower group B strep transmission rate  . Drowning o Maternal:  - Very low risk   - Related to seizures or fainting o Newborn:  - Very low risk. No evidence of increased risk of respiratory problems in multiple large studies - Physiological protection from breathing under water - Avoid underwater birth if there are any fetal complications - Once baby's head is out of the water, keep it out.  . Birth complication o Some reports of cord trauma, but risk decreased by bringing baby to surface gradually o No evidence of increased risk of shoulder dystocia. Mothers can usually change positions faster in water than in a bed, possibly aiding the maneuvers to free the shoulder.   You must attend a Waterbirth class at [facility]  3rd Wednesday of every month from 7-9pm  Free  Register by calling [phone] or online at [email or web address]  Bring us the certificate from the class to your prenatal appointment  Meet with a midwife at 36 weeks to see if you can still plan a waterbirth and to sign the consent.   Purchase or rent the following supplies:   Water Birth Pool (Birth Pool in a Box or [place] for instance)  (Tubs start ~$125)  Single-use disposable tub liner designed for your brand of tub  New garden hose labeled  "lead-free", "suitable for drinking water",  Electric drain pump to remove water (We recommend 792 gallon per hour or greater pump.)   Separate garden hose to remove the dirty water  Fish net  Bathing suit top (optional)  Long-handled mirror (optional)  Places to purchase or rent supplies  Yourwaterbirth.com for tub purchases and supplies  Waterbirthsolutions.com for tub purchases and supplies  The Labor Ladies (www.[email or web address]) $275 for tub rental/set-up & take down/kit   [facility] [facility] [employer] (http://www.padanc.org/MeetUs.htm) Information regarding doulas (labor support) who provide pool rentals  Our practice has a Birth Pool in a Box tub at the hospital that you may borrow on a first-come-first-served basis. It is your responsibility to to set up, clean and break down the tub. We cannot guarantee the availability of this tub in advance. You are responsible for bringing all accessories listed above. If you do not have all necessary supplies you cannot have a waterbirth.    Things that would prevent you from having a waterbirth:  Premature, <37wks  Previous cesarean birth  Presence of thick meconium-stained fluid  Multiple gestation (Twins, triplets, etc.)  Uncontrolled diabetes or gestational diabetes requiring medication  Hypertension requiring medication   or diagnosis of pre-eclampsia  Heavy vaginal bleeding  Non-reassuring fetal heart rate  Active infection (MRSA, etc.). Group B Strep is NOT a contraindication for  waterbirth.  If your labor has to be induced and induction method requires continuous  monitoring of the baby's heart rate  Other risks/issues identified by your obstetrical provider  Please remember that birth is unpredictable. Under certain unforeseeable circumstances your provider may advise against giving birth in the tub. These decisions will be made on a case-by-case basis and with the safety of you and your baby as our highest  priority.      

## 2017-08-06 NOTE — Progress Notes (Signed)
  Subjective:    Jill Thomas is being seen today for her first obstetrical visit.  This is not a planned pregnancy. She is at 4356w2d gestation. Her obstetrical history is significant for none. Relationship with FOB: significant other, not living together. Patient does intend to breast feed. Pregnancy history fully reviewed.  Patient reports nausea and has found that chewing gum "all day" helps. .  Review of Systems:   Review of Systems  Constitutional: Negative for fever.  Gastrointestinal: Positive for nausea. Negative for vomiting.  Genitourinary: Negative for pelvic pain and vaginal bleeding.    Objective:     BP (!) 113/58   Pulse 79   Wt 160 lb 12.8 oz (72.9 kg)   LMP 05/12/2017 (Exact Date)   BMI 31.40 kg/m  Physical Exam  Nursing note and vitals reviewed. Constitutional: She is oriented to person, place, and time. She appears well-developed and well-nourished. No distress.  Cardiovascular: Normal rate.  Respiratory: Effort normal. Right breast exhibits inverted nipple. Right breast exhibits no mass, no nipple discharge, no skin change and no tenderness. Left breast exhibits no inverted nipple, no mass, no nipple discharge, no skin change and no tenderness. Breasts are symmetrical.  GI: Soft. There is no tenderness. There is no rebound.  Neurological: She is alert and oriented to person, place, and time.  Skin: Skin is warm and dry.  Psychiatric: She has a normal mood and affect.      Fetal Exam Fetal Monitor Review: Mode: hand-held doppler probe.   Baseline rate: 159.         Assessment:    Pregnancy: [id number] Patient Active Problem List   Diagnosis Date Noted  . Supervision of other normal pregnancy, antepartum 08/06/2017  . Active labor 04/24/2014  . Chlamydia 04/05/2014  . Chlamydia infection 03/31/2014  . Adolescent pregnancy 10/08/2013       Plan:     Initial labs drawn. Had flu vax in September  Patient does not wish to have a pap  today, states that she will get that done at her next visit.  Prenatal vitamins. Problem list reviewed and updated. Panorama discussed: requests Offer AFP for NTD screen at next visit Role of ultrasound in pregnancy discussed; fetal survey: requested. Amniocentesis discussed: not indicated. Follow up in 4 weeks. 50% of 45 min visit spent on counseling and coordination of care.     Thressa ShellerHeather Joby Richart 08/06/2017

## 2017-08-07 ENCOUNTER — Encounter: Payer: Self-pay | Admitting: *Deleted

## 2017-08-07 LAB — GC/CHLAMYDIA PROBE AMP (~~LOC~~) NOT AT ARMC
CHLAMYDIA, DNA PROBE: NEGATIVE
Neisseria Gonorrhea: NEGATIVE

## 2017-08-08 ENCOUNTER — Encounter: Payer: Self-pay | Admitting: Advanced Practice Midwife

## 2017-08-08 LAB — URINE CULTURE, OB REFLEX

## 2017-08-08 LAB — CULTURE, OB URINE

## 2017-08-13 ENCOUNTER — Other Ambulatory Visit: Payer: Self-pay | Admitting: Advanced Practice Midwife

## 2017-08-13 DIAGNOSIS — Z3480 Encounter for supervision of other normal pregnancy, unspecified trimester: Secondary | ICD-10-CM

## 2017-08-13 LAB — OBSTETRIC PANEL, INCLUDING HIV
Antibody Screen: NEGATIVE
BASOS ABS: 0 10*3/uL (ref 0.0–0.2)
Basos: 1 %
EOS (ABSOLUTE): 0 10*3/uL (ref 0.0–0.4)
Eos: 1 %
HEP B S AG: NEGATIVE
HIV Screen 4th Generation wRfx: NONREACTIVE
Hematocrit: 40.4 % (ref 34.0–46.6)
Hemoglobin: 13.7 g/dL (ref 11.1–15.9)
Immature Grans (Abs): 0 10*3/uL (ref 0.0–0.1)
Immature Granulocytes: 0 %
Lymphocytes Absolute: 1.9 10*3/uL (ref 0.7–3.1)
Lymphs: 30 %
MCH: 29.7 pg (ref 26.6–33.0)
MCHC: 33.9 g/dL (ref 31.5–35.7)
MCV: 87 fL (ref 79–97)
Monocytes Absolute: 0.5 10*3/uL (ref 0.1–0.9)
Monocytes: 9 %
NEUTROS ABS: 3.7 10*3/uL (ref 1.4–7.0)
Neutrophils: 59 %
Platelets: 325 10*3/uL (ref 150–379)
RBC: 4.62 x10E6/uL (ref 3.77–5.28)
RDW: 15 % (ref 12.3–15.4)
RPR: NONREACTIVE
Rh Factor: POSITIVE
Rubella Antibodies, IGG: 1.83 index (ref >0.99)
WBC: 6.2 10*3/uL (ref 3.4–10.8)

## 2017-08-13 LAB — SMN1 COPY NUMBER ANALYSIS (SMA CARRIER SCREENING)

## 2017-08-13 LAB — CYSTIC FIBROSIS GENE TEST

## 2017-08-14 ENCOUNTER — Encounter: Payer: Self-pay | Admitting: Advanced Practice Midwife

## 2017-08-14 ENCOUNTER — Telehealth: Payer: Self-pay | Admitting: General Practice

## 2017-08-14 NOTE — Telephone Encounter (Signed)
Called patient regarding mychart results and explained that urine results are normal. Patient verbalized understanding and had no questions

## 2017-08-15 ENCOUNTER — Encounter: Payer: Self-pay | Admitting: *Deleted

## 2017-08-15 ENCOUNTER — Encounter: Payer: Self-pay | Admitting: Advanced Practice Midwife

## 2017-08-29 ENCOUNTER — Encounter: Payer: Self-pay | Admitting: *Deleted

## 2017-08-31 ENCOUNTER — Encounter: Payer: Self-pay | Admitting: Advanced Practice Midwife

## 2017-09-03 ENCOUNTER — Ambulatory Visit (HOSPITAL_COMMUNITY): Payer: Medicaid Other

## 2017-09-03 ENCOUNTER — Encounter: Payer: Medicaid Other | Admitting: Advanced Practice Midwife

## 2017-09-04 ENCOUNTER — Encounter: Payer: Self-pay | Admitting: Advanced Practice Midwife

## 2017-09-04 ENCOUNTER — Encounter (HOSPITAL_COMMUNITY): Payer: Self-pay | Admitting: *Deleted

## 2017-09-04 ENCOUNTER — Other Ambulatory Visit: Payer: Self-pay

## 2017-09-04 ENCOUNTER — Inpatient Hospital Stay (HOSPITAL_COMMUNITY)
Admission: AD | Admit: 2017-09-04 | Discharge: 2017-09-04 | Disposition: A | Discharge status: Home / Self Care | Payer: Medicaid Other | Source: Ambulatory Visit | Attending: Obstetrics & Gynecology | Admitting: Obstetrics & Gynecology

## 2017-09-04 DIAGNOSIS — O26892 Other specified pregnancy related conditions, second trimester: Secondary | ICD-10-CM | POA: Insufficient documentation

## 2017-09-04 DIAGNOSIS — Z3A16 16 weeks gestation of pregnancy: Secondary | ICD-10-CM | POA: Diagnosis not present

## 2017-09-04 DIAGNOSIS — Z8249 Family history of ischemic heart disease and other diseases of the circulatory system: Secondary | ICD-10-CM | POA: Diagnosis not present

## 2017-09-04 DIAGNOSIS — Z79899 Other long term (current) drug therapy: Secondary | ICD-10-CM | POA: Insufficient documentation

## 2017-09-04 DIAGNOSIS — R109 Unspecified abdominal pain: Secondary | ICD-10-CM | POA: Diagnosis not present

## 2017-09-04 DIAGNOSIS — M549 Dorsalgia, unspecified: Secondary | ICD-10-CM | POA: Diagnosis present

## 2017-09-04 DIAGNOSIS — Z809 Family history of malignant neoplasm, unspecified: Secondary | ICD-10-CM | POA: Insufficient documentation

## 2017-09-04 DIAGNOSIS — Z3492 Encounter for supervision of normal pregnancy, unspecified, second trimester: Secondary | ICD-10-CM

## 2017-09-04 LAB — URINALYSIS, ROUTINE W REFLEX MICROSCOPIC
BILIRUBIN URINE: NEGATIVE
Bacteria, UA: NONE SEEN
GLUCOSE, UA: NEGATIVE mg/dL
Hgb urine dipstick: NEGATIVE
Ketones, ur: NEGATIVE mg/dL
Nitrite: NEGATIVE
PH: 7 (ref 5.0–8.0)
Protein, ur: NEGATIVE mg/dL
SPECIFIC GRAVITY, URINE: 1.018 (ref 1.005–1.030)

## 2017-09-04 MED ORDER — ACETAMINOPHEN 500 MG PO TABS
1000.0000 mg | ORAL_TABLET | Freq: Once | ORAL | Status: AC
  Administered 2017-09-04: 1000 mg via ORAL
  Filled 2017-09-04: qty 2

## 2017-09-04 NOTE — MAU Note (Signed)
Woke up at 6 this morning with cramping.  Has gotten worse, now in her back. No GI or GU problems.

## 2017-09-04 NOTE — MAU Provider Note (Signed)
History     CSN: [id number]  Arrival date and time: 09/04/17 1012   First Provider Initiated Contact with Patient 09/04/17 1050      Chief Complaint  Patient presents with  . Abdominal Pain  . Back Pain   HPI Jill Thomas is a 22 y.o. G3P2002 at 4175w3d who presents with abdominal cramping. Symptoms began at 6 this morning. Reports lower abdominal pain that radiates to her low back. She states the pain is constant but worsens in waves. Rates pain 7/10. Has not treated pain. Nothing makes pain better or worse. Denies n/v/d, vaginal bleeding, vaginal discharge, LOF, dysuria, or recent intercourse. Hx of 2 term SVD, no hx of miscarriage or PTL.   OB History    Gravida Para Term Preterm AB Living   3 2 2  0 0 2   SAB TAB Ectopic Multiple Live Births   0 0 0 0 2      Past Medical History:  Diagnosis Date  . Medical history non-contributory     Past Surgical History:  Procedure Laterality Date  . NO PAST SURGERIES      Family History  Problem Relation Age of Onset  . Cancer Maternal Grandfather   . Hypertension Maternal Grandmother     Social History   Tobacco Use  . Smoking status: Never Smoker  . Smokeless tobacco: Never Used  Substance Use Topics  . Alcohol use: No  . Drug use: No    Allergies: No Known Allergies  Medications Prior to Admission  Medication Sig Dispense Refill Last Dose  . Prenatal Multivit-Min-Fe-FA (PRENATAL VITAMINS) 0.8 MG tablet Take 1 tablet by mouth daily. 30 tablet 12   . promethazine (PHENERGAN) 12.5 MG tablet Take 1 tablet (12.5 mg total) by mouth every 6 (six) hours as needed for nausea or vomiting. 30 tablet 0 Taking    Review of Systems  Constitutional: Negative.   Gastrointestinal: Positive for abdominal pain. Negative for constipation, diarrhea, nausea and vomiting.  Genitourinary: Negative.   Musculoskeletal: Positive for back pain.   Physical Exam   Blood pressure 118/66, pulse 90, temperature 98.7 F (37.1 C),  temperature source Oral, resp. rate 16, weight 163 lb (73.9 kg), last menstrual period 05/12/2017, SpO2 100 %.  Physical Exam  Nursing note and vitals reviewed. Constitutional: She is oriented to person, place, and time. She appears well-developed and well-nourished. No distress.  HENT:  Head: Normocephalic and atraumatic.  Eyes: Conjunctivae are normal. Right eye exhibits no discharge. Left eye exhibits no discharge. No scleral icterus.  Neck: Normal range of motion.  Respiratory: Effort normal. No respiratory distress.  GI: Soft. There is no tenderness.  Genitourinary:  Genitourinary Comments: Dilation: Closed Effacement (%): Thick Cervical Position: Posterior Exam by:: Judeth HornErin Bernadette Armijo NP   Neurological: She is alert and oriented to person, place, and time.  Skin: Skin is warm and dry. She is not diaphoretic.  Psychiatric: She has a normal mood and affect. Her behavior is normal. Judgment and thought content normal.    MAU Course  Procedures Results for orders placed or performed during the hospital encounter of 09/04/17 (from the past 24 hour(s))  Urinalysis, Routine w reflex microscopic     Status: Abnormal   Collection Time: 09/04/17 10:26 AM  Result Value Ref Range   Color, Urine YELLOW YELLOW   APPearance TURBID (A) CLEAR   Specific Gravity, Urine 1.018 1.005 - 1.030   pH 7.0 5.0 - 8.0   Glucose, UA NEGATIVE NEGATIVE mg/dL  Hgb urine dipstick NEGATIVE NEGATIVE   Bilirubin Urine NEGATIVE NEGATIVE   Ketones, ur NEGATIVE NEGATIVE mg/dL   Protein, ur NEGATIVE NEGATIVE mg/dL   Nitrite NEGATIVE NEGATIVE   Leukocytes, UA TRACE (A) NEGATIVE   RBC / HPF 6-30 0 - 5 RBC/hpf   WBC, UA 6-30 0 - 5 WBC/hpf   Bacteria, UA NONE SEEN NONE SEEN   Squamous Epithelial / LPF 6-30 (A) NONE SEEN   Mucus PRESENT     MDM FHT 142 by doppler Cervix closed/thick No evidence of miscarriage at this time; FHT present & cervix closed. Patient reports improvement in symptoms after tylenol & PO  hydration. Pt afebrile.   Assessment and Plan  A: 1. Abdominal pain during pregnancy in second trimester   2. [redacted] weeks gestation of pregnancy   3. Fetal heart tones present, second trimester    P: Discharge home Discussed reasons to return to MAU Keep f/u with OB  Judeth Horn 09/04/2017, 10:50 AM

## 2017-09-04 NOTE — Discharge Instructions (Signed)

## 2017-09-05 LAB — CULTURE, OB URINE

## 2017-09-11 ENCOUNTER — Other Ambulatory Visit: Payer: Self-pay | Admitting: Advanced Practice Midwife

## 2017-09-11 ENCOUNTER — Encounter: Payer: Self-pay | Admitting: Advanced Practice Midwife

## 2017-09-11 ENCOUNTER — Ambulatory Visit (HOSPITAL_COMMUNITY)
Admission: RE | Admit: 2017-09-11 | Discharge: 2017-09-11 | Disposition: A | Discharge status: Home / Self Care | Payer: Medicaid Other | Source: Ambulatory Visit | Attending: Advanced Practice Midwife | Admitting: Advanced Practice Midwife

## 2017-09-11 DIAGNOSIS — Z3A17 17 weeks gestation of pregnancy: Secondary | ICD-10-CM | POA: Insufficient documentation

## 2017-09-11 DIAGNOSIS — Z3480 Encounter for supervision of other normal pregnancy, unspecified trimester: Secondary | ICD-10-CM

## 2017-09-11 DIAGNOSIS — Z3689 Encounter for other specified antenatal screening: Secondary | ICD-10-CM | POA: Insufficient documentation

## 2017-09-11 DIAGNOSIS — Z3482 Encounter for supervision of other normal pregnancy, second trimester: Secondary | ICD-10-CM | POA: Diagnosis not present

## 2017-09-24 ENCOUNTER — Other Ambulatory Visit (HOSPITAL_COMMUNITY)
Admission: RE | Admit: 2017-09-24 | Discharge: 2017-09-24 | Disposition: A | Discharge status: Home / Self Care | Payer: Medicaid Other | Source: Ambulatory Visit | Attending: Advanced Practice Midwife | Admitting: Advanced Practice Midwife

## 2017-09-24 ENCOUNTER — Ambulatory Visit (INDEPENDENT_AMBULATORY_CARE_PROVIDER_SITE_OTHER): Payer: Medicaid Other | Admitting: Advanced Practice Midwife

## 2017-09-24 ENCOUNTER — Encounter: Payer: Self-pay | Admitting: Advanced Practice Midwife

## 2017-09-24 DIAGNOSIS — Z3482 Encounter for supervision of other normal pregnancy, second trimester: Secondary | ICD-10-CM

## 2017-09-24 DIAGNOSIS — Z3480 Encounter for supervision of other normal pregnancy, unspecified trimester: Secondary | ICD-10-CM | POA: Diagnosis present

## 2017-09-24 NOTE — Addendum Note (Signed)
Addended by: Lorelle GibbsWILSON, Rosalene Wardrop L on: 09/24/2017 12:08 PM   Modules accepted: Orders

## 2017-09-24 NOTE — Progress Notes (Signed)
   PRENATAL VISIT NOTE  Subjective:  Marijean HeathShanice L Madry is a 22 y.o. G3P2002 at 7174w2d being seen today for ongoing prenatal care.  She is currently monitored for the following issues for this low-risk pregnancy and has Supervision of other normal pregnancy, antepartum and Encounter for fetal anatomic survey on their problem list.  Patient reports no complaints.  Contractions: Not present. Vag. Bleeding: None.  Movement: Present. Denies leaking of fluid.   The following portions of the patient's history were reviewed and updated as appropriate: allergies, current medications, past family history, past medical history, past social history, past surgical history and problem list. Problem list updated.  Objective:   Vitals:   09/24/17 1127  BP: 116/66  Pulse: 96  Weight: 163 lb 11.2 oz (74.3 kg)    Fetal Status: Fetal Heart Rate (bpm): 154   Movement: Present     General:  Alert, oriented and cooperative. Patient is in no acute distress.  Skin: Skin is warm and dry. No rash noted.   Cardiovascular: Normal heart rate noted  Respiratory: Normal respiratory effort, no problems with respiration noted  Abdomen: Soft, gravid, appropriate for gestational age.  Pain/Pressure: Absent     Pelvic: Cervical exam performed        Extremities: Normal range of motion.  Edema: Trace  Mental Status:  Normal mood and affect. Normal behavior. Normal judgment and thought content.   Assessment and Plan:  Pregnancy: G3P2002 at 5574w2d  1. Supervision of other normal pregnancy, antepartum - Pap today  - FU US scheduled today  - Declines AFP   Preterm labor symptoms and general obstetric precautions including but not limited to vaginal bleeding, contractions, leaking of fluid and fetal movement were reviewed in detail with the patient. Please refer to After Visit Summary for other counseling recommendations.  Return in about 4 weeks (around 10/22/2017).   Thressa ShellerHeather Michae Grimley, CNM

## 2017-09-24 NOTE — Patient Instructions (Signed)

## 2017-09-25 LAB — CYTOLOGY - PAP: DIAGNOSIS: NEGATIVE

## 2017-10-22 ENCOUNTER — Encounter: Payer: Medicaid Other | Admitting: Advanced Practice Midwife

## 2017-10-23 ENCOUNTER — Ambulatory Visit (HOSPITAL_COMMUNITY)
Admission: RE | Admit: 2017-10-23 | Discharge: 2017-10-23 | Disposition: A | Discharge status: Home / Self Care | Payer: Medicaid Other | Source: Ambulatory Visit | Attending: Advanced Practice Midwife | Admitting: Advanced Practice Midwife

## 2017-10-23 ENCOUNTER — Ambulatory Visit (INDEPENDENT_AMBULATORY_CARE_PROVIDER_SITE_OTHER): Payer: Medicaid Other | Admitting: Family Medicine

## 2017-10-23 ENCOUNTER — Other Ambulatory Visit: Payer: Self-pay | Admitting: Advanced Practice Midwife

## 2017-10-23 VITALS — BP 119/61 | HR 111 | Wt 164.0 lb

## 2017-10-23 DIAGNOSIS — Z362 Encounter for other antenatal screening follow-up: Secondary | ICD-10-CM

## 2017-10-23 DIAGNOSIS — Z3402 Encounter for supervision of normal first pregnancy, second trimester: Secondary | ICD-10-CM | POA: Insufficient documentation

## 2017-10-23 DIAGNOSIS — Z3A23 23 weeks gestation of pregnancy: Secondary | ICD-10-CM

## 2017-10-23 DIAGNOSIS — Z3482 Encounter for supervision of other normal pregnancy, second trimester: Secondary | ICD-10-CM

## 2017-10-23 DIAGNOSIS — Z3480 Encounter for supervision of other normal pregnancy, unspecified trimester: Secondary | ICD-10-CM

## 2017-10-23 DIAGNOSIS — O283 Abnormal ultrasonic finding on antenatal screening of mother: Secondary | ICD-10-CM | POA: Insufficient documentation

## 2017-10-23 MED ORDER — CETIRIZINE HCL 10 MG PO TABS: 10.0000 mg | ORAL_TABLET | Freq: Every day | ORAL | Status: AC

## 2017-10-23 NOTE — Progress Notes (Signed)
   PRENATAL VISIT NOTE  Subjective:  Jill Thomas is a 22 y.o. G3P2002 at 1814w3d being seen today for ongoing prenatal care.  She is currently monitored for the following issues for this low-risk pregnancy and has Supervision of other normal pregnancy, antepartum; Encounter for fetal anatomic survey; and Echogenic intracardiac focus of fetus on prenatal ultrasound on their problem list.  Patient reports no complaints.  Contractions: Not present. Vag. Bleeding: None.  Movement: Present. Denies leaking of fluid.   The following portions of the patient's history were reviewed and updated as appropriate: allergies, current medications, past family history, past medical history, past social history, past surgical history and problem list. Problem list updated.  Objective:   Vitals:   10/23/17 0947  BP: 119/61  Pulse: (!) 111  Weight: 164 lb (74.4 kg)    Fetal Status: Fetal Heart Rate (bpm): 152 Fundal Height: 23 cm Movement: Present     General:  Alert, oriented and cooperative. Patient is in no acute distress.  Skin: Skin is warm and dry. No rash noted.   Cardiovascular: Normal heart rate noted  Respiratory: Normal respiratory effort, no problems with respiration noted  Abdomen: Soft, gravid, appropriate for gestational age.  Pain/Pressure: Absent     Pelvic: Cervical exam deferred        Extremities: Normal range of motion.  Edema: Trace  Mental Status: Normal mood and affect. Normal behavior. Normal judgment and thought content.   Assessment and Plan:  Pregnancy: G3P2002 at 9214w3d  1. Supervision of other normal pregnancy, antepartum Follow up in 4 weeks  2. Echogenic intracardiac focus of fetus on prenatal ultrasound Isolated finding. Has follow up ultrasound today  3. Allergic rhinitis Zyrtec 10 mg daily   Preterm labor symptoms and general obstetric precautions including but not limited to vaginal bleeding, contractions, leaking of fluid and fetal movement were  reviewed in detail with the patient. Please refer to After Visit Summary for other counseling recommendations.  Return in about 1 month (around 11/20/2017) for LOB.  Future Appointments  Date Time Provider Department Center  11/20/2017  8:50 AM [facility]  11/20/2017 10:15 AM Pincus LargePhelps, Jazma Y, DO [facility]  12/04/2017 10:15 AM Pincus LargePhelps, Jazma Y, DO [facility]  12/17/2017  9:55 AM Armando ReichertHogan, Heather D, CNM [facility]  12/31/2017  9:15 AM Armando ReichertHogan, Heather D, CNM [facility]    Frederik PearJulie P Degele, MD

## 2017-10-27 ENCOUNTER — Encounter: Payer: Self-pay | Admitting: Advanced Practice Midwife

## 2017-10-29 ENCOUNTER — Encounter: Payer: Self-pay | Admitting: Advanced Practice Midwife

## 2017-11-09 ENCOUNTER — Telehealth: Payer: Self-pay | Admitting: *Deleted
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
## 2017-11-20 ENCOUNTER — Encounter: Payer: Medicaid Other | Admitting: Obstetrics and Gynecology

## 2017-11-20 ENCOUNTER — Other Ambulatory Visit: Payer: Medicaid Other

## 2017-11-26 ENCOUNTER — Inpatient Hospital Stay (HOSPITAL_COMMUNITY)
Admission: EM | Admit: 2017-11-26 | Discharge: 2017-11-26 | Disposition: A | Discharge status: Other Acute Inpatient Hospital | Payer: Medicaid Other | Attending: Obstetrics and Gynecology | Admitting: Obstetrics and Gynecology

## 2017-11-26 ENCOUNTER — Inpatient Hospital Stay (HOSPITAL_COMMUNITY): Payer: Medicaid Other

## 2017-11-26 ENCOUNTER — Encounter (HOSPITAL_COMMUNITY): Payer: Self-pay

## 2017-11-26 ENCOUNTER — Other Ambulatory Visit: Payer: Self-pay

## 2017-11-26 DIAGNOSIS — O9A213 Injury, poisoning and certain other consequences of external causes complicating pregnancy, third trimester: Secondary | ICD-10-CM | POA: Diagnosis not present

## 2017-11-26 DIAGNOSIS — O9989 Other specified diseases and conditions complicating pregnancy, childbirth and the puerperium: Secondary | ICD-10-CM | POA: Insufficient documentation

## 2017-11-26 DIAGNOSIS — O368130 Decreased fetal movements, third trimester, not applicable or unspecified: Secondary | ICD-10-CM | POA: Diagnosis not present

## 2017-11-26 DIAGNOSIS — M545 Low back pain: Secondary | ICD-10-CM | POA: Diagnosis not present

## 2017-11-26 DIAGNOSIS — Z3490 Encounter for supervision of normal pregnancy, unspecified, unspecified trimester: Secondary | ICD-10-CM

## 2017-11-26 DIAGNOSIS — Z79899 Other long term (current) drug therapy: Secondary | ICD-10-CM | POA: Diagnosis not present

## 2017-11-26 DIAGNOSIS — R1030 Lower abdominal pain, unspecified: Secondary | ICD-10-CM | POA: Diagnosis not present

## 2017-11-26 DIAGNOSIS — Z3A28 28 weeks gestation of pregnancy: Secondary | ICD-10-CM

## 2017-11-26 DIAGNOSIS — R109 Unspecified abdominal pain: Secondary | ICD-10-CM

## 2017-11-26 LAB — URINALYSIS, ROUTINE W REFLEX MICROSCOPIC
BILIRUBIN URINE: NEGATIVE
GLUCOSE, UA: NEGATIVE mg/dL
HGB URINE DIPSTICK: NEGATIVE
Ketones, ur: 5 mg/dL — AB
Leukocytes, UA: NEGATIVE
Nitrite: NEGATIVE
PH: 8 (ref 5.0–8.0)
Protein, ur: NEGATIVE mg/dL
SPECIFIC GRAVITY, URINE: 1.006 (ref 1.005–1.030)
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
## 2017-11-26 MED ORDER — SODIUM CHLORIDE 0.9 % IV BOLUS
1000.0000 mL | Freq: Once | INTRAVENOUS | Status: AC
  Administered 2017-11-26: 1000 mL via INTRAVENOUS
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
## 2017-11-26 NOTE — Discharge Instructions (Signed)
Return to [facility] for worsening abdominal pain, leaking of fluid like your water broke, or bright red vaginal bleeding.     Preventing Injuries During Pregnancy Trauma is the most common cause of injury and death in pregnant women. This can also result in serious harm to the baby or even death. How can injuries affect my pregnancy? Your baby is protected in the womb (uterus) by a sac filled with fluid (amniotic sac). Your baby can be harmed if there is a direct blow to your abdomen and pelvis. Trauma may be caused by:  Falls. These are more common in the second and third trimester of pregnancy.  Automobile accidents.  Domestic violence or assault.  Severe burns, such as from fire or electricity.  These injuries can result in:  Tearing of your uterus.  The placenta pulling away from the wall of the uterus (placental abruption).  The amniotic sac breaking open (rupture of membranes).  Blockage or decrease in the blood supply to your baby.  Going into labor earlier than expected.  Severe injuries to other parts of your body, such as your brain, spine, heart, lungs, or other organs.  Minor falls and low-impact automobile accidents do not usually harm your baby, even if they cause a little harm to you. What can I do to lower my risk? Safety  Remove slippery rugs and loose objects on the floor. They increase your risk of tripping or slipping.  Wear comfortable shoes that have a good grip on the sole. Do not wear high-heeled shoes.  Always wear your seat belt properly when riding in a car. Use both the lap and shoulder belt, with the lap belt below your abdomen. Always practice safe driving. Do not ride on a motorcycle while pregnant. Activity  Avoid walking on wet or slippery floors.  Do not participate in rough and violent activities or sports.  Avoid high-risk situations and activities such as: ? Lifting heavy pots of boiling or hot liquids. ? Fixing electrical  problems. ? Being near fires or starting fires. General instructions  Take over-the-counter and prescription medicines only as told by your health care provider.  Know your blood type and the father's blood type in case you develop vaginal bleeding or experience an injury for which a blood transfusion is needed.  Spousal abuse can be a serious cause of trauma during pregnancy. If you are a victim of domestic violence or assault: ? Call your local emergency services (911 in the [place] ? Contact the [employer] Violence Hotline for help and support. When should I seek immediate medical care? Get help right away if:  You fall on your abdomen or experience any serious blow to your abdomen.  You develop stiffness in your neck or pain after a fall or from other trauma.  You develop a headache or vision problems after a fall or from other trauma.  You do not feel the baby moving after a fall or trauma, or you feel that the baby is not moving as much as before the fall or trauma.  You have been the victim of domestic violence or any other kind of physical attack.  You have been in a car accident.  You develop vaginal bleeding.  You have fluid leaking from the vagina.  You develop uterine contractions. Symptoms include pelvic cramping, pain, or serious low back pain.  You become weak, faint, or have uncontrolled vomiting after trauma.  You have a serious burn. This includes burns to the face, neck, hands,  or genitals, or burns greater than the size of your palm anywhere else.  Summary  Trauma is the most common cause of injury and death in pregnant women and can also lead to injury or death of the baby.  Falls, automobile accidents, domestic violence or assault, and severe burns can injure you or your baby. Make sure to get medical help right away if you experience any of these during your pregnancy.  Take steps to prevent slips or falls in your home, such as avoiding slippery  floors and removing loose rugs.  Always wear your seat belt properly when riding in a car. Practice safe driving. This information is not intended to replace advice given to you by your health care provider. Make sure you discuss any questions you have with your health care provider. Document Released: 08/03/2004 Document Revised: 07/05/2016 Document Reviewed: 07/05/2016 Elsevier Interactive Patient Education  2017 Elsevier Inc[employer]    Fetal Movement Counts Patient Name: ________________________________________________ Patient Due Date: ____________________ What is a fetal movement count? A fetal movement count is the number of times that you feel your baby move during a certain amount of time. This may also be called a fetal kick count. A fetal movement count is recommended for every pregnant woman. You may be asked to start counting fetal movements as early as week 28 of your pregnancy. Pay attention to when your baby is most active. You may notice your baby's sleep and wake cycles. You may also notice things that make your baby move more. You should do a fetal movement count:  When your baby is normally most active.  At the same time each day.  A good time to count movements is while you are resting, after having something to eat and drink. How do I count fetal movements? 1. Find a quiet, comfortable area. Sit, or lie down on your side. 2. Write down the date, the start time and stop time, and the number of movements that you felt between those two times. Take this information with you to your health care visits. 3. For 2 hours, count kicks, flutters, swishes, rolls, and jabs. You should feel at least 10 movements during 2 hours. 4. You may stop counting after you have felt 10 movements. 5. If you do not feel 10 movements in 2 hours, have something to eat and drink. Then, keep resting and counting for 1 hour. If you feel at least 4 movements during that hour, you may stop counting. Contact  a health care provider if:  You feel fewer than 4 movements in 2 hours.  Your baby is not moving like he or she usually does. Date: ____________ Start time: ____________ Stop time: ____________ Movements: ____________ Date: ____________ Start time: ____________ Stop time: ____________ Movements: ____________ Date: ____________ Start time: ____________ Stop time: ____________ Movements: ____________ Date: ____________ Start time: ____________ Stop time: ____________ Movements: ____________ Date: ____________ Start time: ____________ Stop time: ____________ Movements: ____________ Date: ____________ Start time: ____________ Stop time: ____________ Movements: ____________ Date: ____________ Start time: ____________ Stop time: ____________ Movements: ____________ Date: ____________ Start time: ____________ Stop time: ____________ Movements: ____________ Date: ____________ Start time: ____________ Stop time: ____________ Movements: ____________ This information is not intended to replace advice given to you by your health care provider. Make sure you discuss any questions you have with your health care provider. Document Released: 07/26/2006 Document Revised: 02/23/2016 Document Reviewed: 08/05/2015 Elsevier Interactive Patient Education  [employer].

## 2017-11-26 NOTE — ED Notes (Signed)
Rapid OB RN at patient bedside
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
## 2017-11-30 ENCOUNTER — Other Ambulatory Visit: Payer: Self-pay | Admitting: *Deleted

## 2017-11-30 DIAGNOSIS — Z3480 Encounter for supervision of other normal pregnancy, unspecified trimester: Secondary | ICD-10-CM

## 2017-12-04 ENCOUNTER — Other Ambulatory Visit: Payer: Medicaid Other

## 2017-12-04 ENCOUNTER — Encounter: Payer: Self-pay | Admitting: Obstetrics and Gynecology

## 2017-12-04 ENCOUNTER — Ambulatory Visit (INDEPENDENT_AMBULATORY_CARE_PROVIDER_SITE_OTHER): Payer: Medicaid Other | Admitting: Obstetrics and Gynecology

## 2017-12-04 VITALS — BP 111/64 | HR 102 | Wt 168.7 lb

## 2017-12-04 DIAGNOSIS — Z3483 Encounter for supervision of other normal pregnancy, third trimester: Secondary | ICD-10-CM | POA: Diagnosis not present

## 2017-12-04 DIAGNOSIS — Z23 Encounter for immunization: Secondary | ICD-10-CM

## 2017-12-04 DIAGNOSIS — O283 Abnormal ultrasonic finding on antenatal screening of mother: Secondary | ICD-10-CM

## 2017-12-04 DIAGNOSIS — Z3480 Encounter for supervision of other normal pregnancy, unspecified trimester: Secondary | ICD-10-CM

## 2017-12-04 NOTE — Patient Instructions (Signed)
Contraception Choices Contraception, also called birth control, refers to methods or devices that prevent pregnancy. Hormonal methods Contraceptive implant A contraceptive implant is a thin, plastic tube that contains a hormone. It is inserted into the upper part of the arm. It can remain in place for up to 3 years. Progestin-only injections Progestin-only injections are injections of progestin, a synthetic form of the hormone progesterone. They are given every 3 months by a health care provider. Birth control pills Birth control pills are pills that contain hormones that prevent pregnancy. They must be taken once a day, preferably at the same time each day. Birth control patch The birth control patch contains hormones that prevent pregnancy. It is placed on the skin and must be changed once a week for three weeks and removed on the fourth week. A prescription is needed to use this method of contraception. Vaginal ring A vaginal ring contains hormones that prevent pregnancy. It is placed in the vagina for three weeks and removed on the fourth week. After that, the process is repeated with a new ring. A prescription is needed to use this method of contraception. Emergency contraceptive Emergency contraceptives prevent pregnancy after unprotected sex. They come in pill form and can be taken up to 5 days after sex. They work best the sooner they are taken after having sex. Most emergency contraceptives are available without a prescription. This method should not be used as your only form of birth control. Barrier methods Female condom A female condom is a thin sheath that is worn over the penis during sex. Condoms keep sperm from going inside a woman's body. They can be used with a spermicide to increase their effectiveness. They should be disposed after a single use. Female condom A female condom is a soft, loose-fitting sheath that is put into the vagina before sex. The condom keeps sperm from going  inside a woman's body. They should be disposed after a single use. Diaphragm A diaphragm is a soft, dome-shaped barrier. It is inserted into the vagina before sex, along with a spermicide. The diaphragm blocks sperm from entering the uterus, and the spermicide kills sperm. A diaphragm should be left in the vagina for 6-8 hours after sex and removed within 24 hours. A diaphragm is prescribed and fitted by a health care provider. A diaphragm should be replaced every 1-2 years, after giving birth, after gaining more than 15 lb (6.8 kg), and after pelvic surgery. Cervical cap A cervical cap is a round, soft latex or plastic cup that fits over the cervix. It is inserted into the vagina before sex, along with spermicide. It blocks sperm from entering the uterus. The cap should be left in place for 6-8 hours after sex and removed within 48 hours. A cervical cap must be prescribed and fitted by a health care provider. It should be replaced every 2 years. Sponge A sponge is a soft, circular piece of polyurethane foam with spermicide on it. The sponge helps block sperm from entering the uterus, and the spermicide kills sperm. To use it, you make it wet and then insert it into the vagina. It should be inserted before sex, left in for at least 6 hours after sex, and removed and thrown away within 30 hours. Spermicides Spermicides are chemicals that kill or block sperm from entering the cervix and uterus. They can come as a cream, jelly, suppository, foam, or tablet. A spermicide should be inserted into the vagina with an applicator at least 10-15 minutes before   sex to allow time for it to work. The process must be repeated every time you have sex. Spermicides do not require a prescription. Intrauterine contraception Intrauterine device (IUD) An IUD is a T-shaped device that is put in a woman's uterus. There are two types:  Hormone IUD.This type contains progestin, a synthetic form of the hormone progesterone. This  type can stay in place for 3-5 years.  Copper IUD.This type is wrapped in copper wire. It can stay in place for 10 years.  Permanent methods of contraception Female tubal ligation In this method, a woman's fallopian tubes are sealed, tied, or blocked during surgery to prevent eggs from traveling to the uterus. Hysteroscopic sterilization In this method, a small, flexible insert is placed into each fallopian tube. The inserts cause scar tissue to form in the fallopian tubes and block them, so sperm cannot reach an egg. The procedure takes about 3 months to be effective. Another form of birth control must be used during those 3 months. Female sterilization This is a procedure to tie off the tubes that carry sperm (vasectomy). After the procedure, the man can still ejaculate fluid (semen). Natural planning methods Natural family planning In this method, a couple does not have sex on days when the woman could become pregnant. Calendar method This means keeping track of the length of each menstrual cycle, identifying the days when pregnancy can happen, and not having sex on those days. Ovulation method In this method, a couple avoids sex during ovulation. Symptothermal method This method involves not having sex during ovulation. The woman typically checks for ovulation by watching changes in her temperature and in the consistency of cervical mucus. Post-ovulation method In this method, a couple waits to have sex until after ovulation. Summary  Contraception, also called birth control, means methods or devices that prevent pregnancy.  Hormonal methods of contraception include implants, injections, pills, patches, vaginal rings, and emergency contraceptives.  Barrier methods of contraception can include female condoms, female condoms, diaphragms, cervical caps, sponges, and spermicides.  There are two types of IUDs (intrauterine devices). An IUD can be put in a woman's uterus to prevent pregnancy  for 3-5 years.  Permanent sterilization can be done through a procedure for males, females, or both.  Natural family planning methods involve not having sex on days when the woman could become pregnant. This information is not intended to replace advice given to you by your health care provider. Make sure you discuss any questions you have with your health care provider. Document Released: 06/26/2005 Document Revised: 07/29/2016 Document Reviewed: 07/29/2016 Elsevier Interactive Patient Education  2018 Elsevier Inc.  

## 2017-12-04 NOTE — Progress Notes (Addendum)
Subjective:  Jill Thomas is a 22 y.o. G3P2002 at [redacted]w[redacted]d being seen today for ongoing prenatal care.  She is currently monitored for the following issues for this low-risk pregnancy and has Supervision of other normal pregnancy, antepartum; Encounter for fetal anatomic survey; and Echogenic intracardiac focus of fetus on prenatal ultrasound on their problem list.  Patient reports no complaints.  Contractions: Irregular. Vag. Bleeding: None.  Movement: Present. Denies leaking of fluid.   The following portions of the patient's history were reviewed and updated as appropriate: allergies, current medications, past family history, past medical history, past social history, past surgical history and problem list. Problem list updated.  Objective:   Vitals:   12/04/17 0920  BP: 111/64  Pulse: (!) 102  Weight: 168 lb 11.2 oz (76.5 kg)    Fetal Status: Fetal Heart Rate (bpm): 150 Fundal Height: 29 cm Movement: Present     General:  Alert, oriented and cooperative. Patient is in no acute distress.  Skin: Skin is warm and dry. No rash noted.   Cardiovascular: Normal heart rate noted  Respiratory: Normal respiratory effort, no problems with respiration noted  Abdomen: Soft, gravid, appropriate for gestational age. Pain/Pressure: Present     Pelvic: Vag. Bleeding: None     Cervical exam deferred        Extremities: Normal range of motion.  Edema: Trace  Mental Status: Normal mood and affect. Normal behavior. Normal judgment and thought content.   Urinalysis:      Assessment and Plan:  Pregnancy: G3P2002 at [redacted]w[redacted]d  1. Supervision of other normal pregnancy, antepartum Doing well. Routine care. Getting 28wk labs today. Tdap given.   2. Echogenic intracardiac focus of fetus on prenatal ultrasound Follow-up US still with isolated finding. No further follow-up needed. Low risk genetic testing.   Preterm labor symptoms and general obstetric precautions including but not limited to vaginal  bleeding, contractions, leaking of fluid and fetal movement were reviewed in detail with the patient. Please refer to After Visit Summary for other counseling recommendations.  Return in about 2 weeks (around 12/18/2017) for ob visit.    Pincus Large, DO

## 2017-12-04 NOTE — Addendum Note (Signed)
Addended by: Judd Gaudier on: 12/04/2017 09:47 AM   Modules accepted: Orders

## 2017-12-05 LAB — CBC
HEMOGLOBIN: 9.9 g/dL — AB (ref 11.1–15.9)
Hematocrit: 30.8 % — ABNORMAL LOW (ref 34.0–46.6)
MCH: 27.2 pg (ref 26.6–33.0)
MCHC: 32.1 g/dL (ref 31.5–35.7)
MCV: 85 fL (ref 79–97)
PLATELETS: 225 10*3/uL (ref 150–450)
RBC: 3.64 x10E6/uL — AB (ref 3.77–5.28)
RDW: 14.3 % (ref 12.3–15.4)
WBC: 7.8 10*3/uL (ref 3.4–10.8)

## 2017-12-05 LAB — GLUCOSE TOLERANCE, 2 HOURS W/ 1HR
GLUCOSE, 1 HOUR: 151 mg/dL (ref 65–179)
GLUCOSE, FASTING: 74 mg/dL (ref 65–91)
Glucose, 2 hour: 108 mg/dL (ref 65–152)

## 2017-12-05 LAB — RPR: RPR Ser Ql: NONREACTIVE

## 2017-12-05 LAB — HIV ANTIBODY (ROUTINE TESTING W REFLEX): HIV Screen 4th Generation wRfx: NONREACTIVE

## 2017-12-17 ENCOUNTER — Ambulatory Visit (INDEPENDENT_AMBULATORY_CARE_PROVIDER_SITE_OTHER): Payer: Medicaid Other | Admitting: Advanced Practice Midwife

## 2017-12-17 VITALS — BP 121/61 | HR 89 | Wt 170.3 lb

## 2017-12-17 DIAGNOSIS — Z3483 Encounter for supervision of other normal pregnancy, third trimester: Secondary | ICD-10-CM

## 2017-12-17 NOTE — Progress Notes (Signed)
   PRENATAL VISIT NOTE  Subjective:  Jill Thomas is a 22 y.o. G3P2002 at 6636w2d being seen today for ongoing prenatal care.  She is currently monitored for the following issues for this low-risk pregnancy and has Supervision of other normal pregnancy, antepartum; Encounter for fetal anatomic survey; and Echogenic intracardiac focus of fetus on prenatal ultrasound on their problem list.  Patient reports fatigue, no bleeding, no contractions, no cramping, no leaking and occasional contractions.  Contractions: Irregular. Vag. Bleeding: None.  Movement: Present. Denies leaking of fluid.   Pt reports she drinks 2-3 8 oz bottles of water per day. Works as Librarian, academicday care teacher, experiencing upper abdominal tightening for short periods throughout the day,  The following portions of the patient's history were reviewed and updated as appropriate: allergies, current medications, past family history, past medical history, past social history, past surgical history and problem list. Problem list updated.  Objective:   Vitals:   12/17/17 1010  BP: 121/61  Pulse: 89  Weight: 170 lb 4.8 oz (77.2 kg)    Fetal Status: Fetal Heart Rate (bpm): 143   Movement: Present   Fundal height: 31  General:  Alert, oriented and cooperative. Patient is in no acute distress.  Skin: Skin is warm and dry. No rash noted.   Cardiovascular: Normal heart rate noted  Respiratory: Normal respiratory effort, no problems with respiration noted  Abdomen: Soft, gravid, appropriate for gestational age.  Pain/Pressure: Present     Pelvic: Cervical exam deferred        Extremities: Normal range of motion.  Edema: Trace  Mental Status: Normal mood and affect. Normal behavior. Normal judgment and thought content.   Assessment and Plan:  Pregnancy: G3P2002 at 3036w2d  1. Encounter for supervision of other normal pregnancy in third trimester --Feeling well today --Discussed Braxton Hicks contractions vs. Labor  contractions --Anticipatory discussion of round ligament pain as normal part of third trimester discomfort --Optimal PO hydration with water at least 64 oz per day  Preterm labor symptoms and general obstetric precautions including but not limited to vaginal bleeding, contractions, leaking of fluid and fetal movement were reviewed in detail with the patient. Please refer to After Visit Summary for other counseling recommendations.  Return in about 2 weeks (around 12/31/2017).  Future Appointments  Date Time Provider Department Center  12/31/2017  9:15 AM Armando ReichertHogan, Heather D, CNM [facility]    Calvert CantorSamantha C Woodrow Dulski, [place]  12/17/17  10:30 AM

## 2017-12-17 NOTE — Patient Instructions (Signed)

## 2017-12-19 ENCOUNTER — Encounter (HOSPITAL_COMMUNITY): Payer: Self-pay | Admitting: *Deleted

## 2017-12-19 ENCOUNTER — Other Ambulatory Visit: Payer: Self-pay

## 2017-12-19 ENCOUNTER — Inpatient Hospital Stay (HOSPITAL_COMMUNITY)
Admission: AD | Admit: 2017-12-19 | Discharge: 2017-12-19 | Disposition: A | Discharge status: Home / Self Care | Payer: Medicaid Other | Source: Ambulatory Visit | Attending: Obstetrics and Gynecology | Admitting: Obstetrics and Gynecology

## 2017-12-19 DIAGNOSIS — Z87891 Personal history of nicotine dependence: Secondary | ICD-10-CM | POA: Diagnosis not present

## 2017-12-19 DIAGNOSIS — R102 Pelvic and perineal pain: Secondary | ICD-10-CM | POA: Diagnosis not present

## 2017-12-19 DIAGNOSIS — N949 Unspecified condition associated with female genital organs and menstrual cycle: Secondary | ICD-10-CM | POA: Diagnosis not present

## 2017-12-19 DIAGNOSIS — O9989 Other specified diseases and conditions complicating pregnancy, childbirth and the puerperium: Secondary | ICD-10-CM

## 2017-12-19 DIAGNOSIS — Z3A31 31 weeks gestation of pregnancy: Secondary | ICD-10-CM | POA: Diagnosis not present

## 2017-12-19 DIAGNOSIS — O26893 Other specified pregnancy related conditions, third trimester: Secondary | ICD-10-CM | POA: Insufficient documentation

## 2017-12-19 DIAGNOSIS — Z3480 Encounter for supervision of other normal pregnancy, unspecified trimester: Secondary | ICD-10-CM

## 2017-12-19 DIAGNOSIS — R109 Unspecified abdominal pain: Secondary | ICD-10-CM | POA: Diagnosis present

## 2017-12-19 DIAGNOSIS — M549 Dorsalgia, unspecified: Secondary | ICD-10-CM | POA: Insufficient documentation

## 2017-12-19 HISTORY — DX: Headache: R51

## 2017-12-19 HISTORY — DX: Headache, unspecified: R51.9

## 2017-12-19 LAB — URINALYSIS, ROUTINE W REFLEX MICROSCOPIC
Bilirubin Urine: NEGATIVE
Glucose, UA: NEGATIVE mg/dL
Hgb urine dipstick: NEGATIVE
Ketones, ur: NEGATIVE mg/dL
Nitrite: NEGATIVE
Protein, ur: NEGATIVE mg/dL
SPECIFIC GRAVITY, URINE: 1.002 — AB (ref 1.005–1.030)
pH: 7 (ref 5.0–8.0)

## 2017-12-19 LAB — FETAL FIBRONECTIN: FETAL FIBRONECTIN: NEGATIVE

## 2017-12-19 MED ORDER — CYCLOBENZAPRINE HCL 10 MG PO TABS: 10.0000 mg | ORAL_TABLET | Freq: Two times a day (BID) | ORAL | 0 refills | Status: DC | PRN

## 2017-12-19 MED ORDER — NIFEDIPINE 10 MG PO CAPS
10.0000 mg | ORAL_CAPSULE | ORAL | Status: DC | PRN
  Administered 2017-12-19 (×2): 10 mg via ORAL
  Filled 2017-12-19 (×2): qty 1

## 2017-12-19 NOTE — Discharge Instructions (Signed)
Braxton Hicks Contractions °Contractions of the uterus can occur throughout pregnancy, but they are not always a sign that you are in labor. You may have practice contractions called Braxton Hicks contractions. These false labor contractions are sometimes confused with true labor. °What are Braxton Hicks contractions? °Braxton Hicks contractions are tightening movements that occur in the muscles of the uterus before labor. Unlike true labor contractions, these contractions do not result in opening (dilation) and thinning of the cervix. Toward the end of pregnancy (32-34 weeks), Braxton Hicks contractions can happen more often and may become stronger. These contractions are sometimes difficult to tell apart from true labor because they can be very uncomfortable. You should not feel embarrassed if you go to the hospital with false labor. °Sometimes, the only way to tell if you are in true labor is for your health care provider to look for changes in the cervix. The health care provider will do a physical exam and may monitor your contractions. If you are not in true labor, the exam should show that your cervix is not dilating and your water has not broken. °If there are other health problems associated with your pregnancy, it is completely safe for you to be sent home with false labor. You may continue to have Braxton Hicks contractions until you go into true labor. °How to tell the difference between true labor and false labor °True labor °· Contractions last 30-70 seconds. °· Contractions become very regular. °· Discomfort is usually felt in the top of the uterus, and it spreads to the lower abdomen and low back. °· Contractions do not go away with walking. °· Contractions usually become more intense and increase in frequency. °· The cervix dilates and gets thinner. °False labor °· Contractions are usually shorter and not as strong as true labor contractions. °· Contractions are usually irregular. °· Contractions  are often felt in the front of the lower abdomen and in the groin. °· Contractions may go away when you walk around or change positions while lying down. °· Contractions get weaker and are shorter-lasting as time goes on. °· The cervix usually does not dilate or become thin. °Follow these instructions at home: °· Take over-the-counter and prescription medicines only as told by your health care provider. °· Keep up with your usual exercises and follow other instructions from your health care provider. °· Eat and drink lightly if you think you are going into labor. °· If Braxton Hicks contractions are making you uncomfortable: °? Change your position from lying down or resting to walking, or change from walking to resting. °? Sit and rest in a tub of warm water. °? Drink enough fluid to keep your urine pale yellow. Dehydration may cause these contractions. °? Do slow and deep breathing several times an hour. °· Keep all follow-up prenatal visits as told by your health care provider. This is important. °Contact a health care provider if: °· You have a fever. °· You have continuous pain in your abdomen. °Get help right away if: °· Your contractions become stronger, more regular, and closer together. °· You have fluid leaking or gushing from your vagina. °· You pass blood-tinged mucus (bloody show). °· You have bleeding from your vagina. °· You have low back pain that you never had before. °· You feel your baby’s head pushing down and causing pelvic pressure. °· Your baby is not moving inside you as much as it used to. °Summary °· Contractions that occur before labor are called Braxton   Hicks contractions, false labor, or practice contractions.  Braxton Hicks contractions are usually shorter, weaker, farther apart, and less regular than true labor contractions. True labor contractions usually become progressively stronger and regular and they become more frequent.  Manage discomfort from [facility] contractions by  changing position, resting in a warm bath, drinking plenty of water, or practicing deep breathing. This information is not intended to replace advice given to you by your health care provider. Make sure you discuss any questions you have with your health care provider. Document Released: 11/09/2016 Document Revised: 11/09/2016 Document Reviewed: 11/09/2016 Elsevier Interactive Patient Education  2018 [employer] Fetal Movement Counts Patient Name: ________________________________________________ Patient Due Date: ____________________ What is a fetal movement count? A fetal movement count is the number of times that you feel your baby move during a certain amount of time. This may also be called a fetal kick count. A fetal movement count is recommended for every pregnant woman. You may be asked to start counting fetal movements as early as week 28 of your pregnancy. Pay attention to when your baby is most active. You may notice your baby's sleep and wake cycles. You may also notice things that make your baby move more. You should do a fetal movement count:  When your baby is normally most active.  At the same time each day.  A good time to count movements is while you are resting, after having something to eat and drink. How do I count fetal movements? 1. Find a quiet, comfortable area. Sit, or lie down on your side. 2. Write down the date, the start time and stop time, and the number of movements that you felt between those two times. Take this information with you to your health care visits. 3. For 2 hours, count kicks, flutters, swishes, rolls, and jabs. You should feel at least 10 movements during 2 hours. 4. You may stop counting after you have felt 10 movements. 5. If you do not feel 10 movements in 2 hours, have something to eat and drink. Then, keep resting and counting for 1 hour. If you feel at least 4 movements during that hour, you may stop counting. Contact a health care provider  if:  You feel fewer than 4 movements in 2 hours.  Your baby is not moving like he or she usually does. Date: ____________ Start time: ____________ Stop time: ____________ Movements: ____________ Date: ____________ Start time: ____________ Stop time: ____________ Movements: ____________ Date: ____________ Start time: ____________ Stop time: ____________ Movements: ____________ Date: ____________ Start time: ____________ Stop time: ____________ Movements: ____________ Date: ____________ Start time: ____________ Stop time: ____________ Movements: ____________ Date: ____________ Start time: ____________ Stop time: ____________ Movements: ____________ Date: ____________ Start time: ____________ Stop time: ____________ Movements: ____________ Date: ____________ Start time: ____________ Stop time: ____________ Movements: ____________ Date: ____________ Start time: ____________ Stop time: ____________ Movements: ____________ This information is not intended to replace advice given to you by your health care provider. Make sure you discuss any questions you have with your health care provider. Document Released: 07/26/2006 Document Revised: 02/23/2016 Document Reviewed: 08/05/2015 Elsevier Interactive Patient Education  2018 [employer]. Round Ligament Pain The round ligament is a cord of muscle and tissue that helps to support the uterus. It can become a source of pain during pregnancy if it becomes stretched or twisted as the baby grows. The pain usually begins in the second trimester of pregnancy, and it can come and go until the baby is delivered. It is  not a serious problem, and it does not cause harm to the baby. Round ligament pain is usually a short, sharp, and pinching pain, but it can also be a dull, lingering, and aching pain. The pain is felt in the lower side of the abdomen or in the groin. It usually starts deep in the groin and moves up to the outside of the hip area. Pain can occur  with:  A sudden change in position.  Rolling over in bed.  Coughing or sneezing.  Physical activity.  Follow these instructions at home: Watch your condition for any changes. Take these steps to help with your pain:  When the pain starts, relax. Then try: ? Sitting down. ? Flexing your knees up to your abdomen. ? Lying on your side with one pillow under your abdomen and another pillow between your legs. ? Sitting in a warm bath for 15-20 minutes or until the pain goes away.  Take over-the-counter and prescription medicines only as told by your health care provider.  Move slowly when you sit and stand.  Avoid long walks if they cause pain.  Stop or lessen your physical activities if they cause pain.  Contact a health care provider if:  Your pain does not go away with treatment.  You feel pain in your back that you did not have before.  Your medicine is not helping. Get help right away if:  You develop a fever or chills.  You develop uterine contractions.  You develop vaginal bleeding.  You develop nausea or vomiting.  You develop diarrhea.  You have pain when you urinate. This information is not intended to replace advice given to you by your health care provider. Make sure you discuss any questions you have with your health care provider. Document Released: 04/04/2008 Document Revised: 12/02/2015 Document Reviewed: 09/02/2014 Elsevier Interactive Patient Education  2018 [employer] Back Pain in Pregnancy Back pain during pregnancy is common. Back pain may be caused by several factors that are related to changes during your pregnancy. Follow these instructions at home: Managing pain, stiffness, and swelling  If directed, apply ice for sudden (acute) back pain. ? Put ice in a plastic bag. ? Place a towel between your skin and the bag. ? Leave the ice on for 20 minutes, 2-3 times per day.  If directed, apply heat to the affected area before you  exercise: ? Place a towel between your skin and the heat pack or heating pad. ? Leave the heat on for 20-30 minutes. ? Remove the heat if your skin turns bright red. This is especially important if you are unable to feel pain, heat, or cold. You may have a greater risk of getting burned. Activity  Exercise as told by your health care provider. Exercising is the best way to prevent or manage back pain.  Listen to your body when lifting. If lifting hurts, ask for help or bend your knees. This uses your leg muscles instead of your back muscles.  Squat down when picking up something from the floor. Do not bend over.  Only use bed rest as told by your health care provider. Bed rest should only be used for the most severe episodes of back pain. Standing, Sitting, and Lying Down  Do not stand in one place for long periods of time.  Use good posture when sitting. Make sure your head rests over your shoulders and is not hanging forward. Use a pillow on your lower back if necessary.  Try  sleeping on your side, preferably the left side, with a pillow or two between your legs. If you are sore after a night's rest, your bed may be too soft. A firm mattress may provide more support for your back during pregnancy. General instructions  Do not wear high heels.  Eat a healthy diet. Try to gain weight within your health care provider's recommendations.  Use a maternity girdle, elastic sling, or back brace as told by your health care provider.  Take over-the-counter and prescription medicines only as told by your health care provider.  Keep all follow-up visits as told by your health care provider. This is important. This includes any visits with any specialists, such as a physical therapist. Contact a health care provider if:  Your back pain interferes with your daily activities.  You have increasing pain in other parts of your body. Get help right away if:  You develop numbness, tingling,  weakness, or problems with the use of your arms or legs.  You develop severe back pain that is not controlled with medicine.  You have a sudden change in bowel or bladder control.  You develop shortness of breath, dizziness, or you faint.  You develop nausea, vomiting, or sweating.  You have back pain that is a rhythmic, cramping pain similar to labor pains. Labor pain is usually 1-2 minutes apart, lasts for about 1 minute, and involves a bearing down feeling or pressure in your pelvis.  You have back pain and your water breaks or you have vaginal bleeding.  You have back pain or numbness that travels down your leg.  Your back pain developed after you fell.  You develop pain on one side of your back.  You see blood in your urine.  You develop skin blisters in the area of your back pain. This information is not intended to replace advice given to you by your health care provider. Make sure you discuss any questions you have with your health care provider. Document Released: 10/04/2005 Document Revised: 12/02/2015 Document Reviewed: 03/10/2015 Elsevier Interactive Patient Education  [employer].

## 2017-12-19 NOTE — MAU Provider Note (Signed)
History     CSN: [id number]  Arrival date and time: 12/19/17 1243   First Provider Initiated Contact with Patient 12/19/17 1318      Chief Complaint  Patient presents with  . Back Pain  . Abdominal Pain   HPI [name] is a 22 y.o. G3P2002 at 3417w4d who presents to MAU today with complaint of abdominal, back and thigh pain worsening over the last 3 days. The patient states that she had a regular OB appointment on 6/10 and mentioned the intermittent tightening of the upper abdomen. She was counseled on BH contractions, but pain has worsened and is now constant. She denies vaginal bleeding, LOF, UTI symptoms or recent intercourse or exam. She states pain is worse with rest. She tried Tylenol once a few days ago without relief. She reports normal fetal movement.   OB History    Gravida  3   Para  2   Term  2   Preterm  0   AB  0   Living  2     SAB  0   TAB  0   Ectopic  0   Multiple  0   Live Births  2           Past Medical History:  Diagnosis Date  . Headache   . Medical history non-contributory     Past Surgical History:  Procedure Laterality Date  . NO PAST SURGERIES      Family History  Problem Relation Age of Onset  . Cancer Maternal Grandfather        pancreatic  . Hypertension Maternal Grandmother     Social History   Tobacco Use  . Smoking status: Former Smoker    Types: Cigarettes  . Smokeless tobacco: Never Used  . Tobacco comment: quit May 2019  Substance Use Topics  . Alcohol use: No  . Drug use: No    Allergies: No Known Allergies  Facility-Administered Medications Prior to Admission  Medication Dose Route Frequency Provider Last Rate Last Dose  . cetirizine (ZYRTEC) tablet 10 mg  10 mg Oral Daily Degele, Kandra NicolasJulie P, MD       Medications Prior to Admission  Medication Sig Dispense Refill Last Dose  . acetaminophen (TYLENOL) 500 MG tablet Take 500 mg by mouth every 6 (six) hours as needed for mild pain or  headache.   Past Week at Unknown time  . Prenatal Multivit-Min-Fe-FA (PRENATAL VITAMINS) 0.8 MG tablet Take 1 tablet by mouth daily. (Patient not taking: Reported on 12/19/2017) 30 tablet 12 Not Taking at Unknown time    Review of Systems  Constitutional: Negative for fever.  Gastrointestinal: Positive for abdominal pain. Negative for constipation, diarrhea, nausea and vomiting.  Genitourinary: Positive for vaginal discharge. Negative for dysuria, frequency, urgency and vaginal bleeding.  Musculoskeletal: Positive for back pain.   Physical Exam   Blood pressure 131/65, pulse 98, temperature 98.4 F (36.9 C), resp. rate 19, weight 168 lb 4 oz (76.3 kg), last menstrual period 05/12/2017.  Physical Exam  Nursing note and vitals reviewed. Constitutional: She is oriented to person, place, and time. She appears well-developed and well-nourished. No distress.  HENT:  Head: Normocephalic and atraumatic.  Cardiovascular: Normal rate.  Respiratory: Effort normal.  GI: Soft. She exhibits no distension and no mass. There is no tenderness. There is no rebound and no guarding.  Genitourinary: Uterus is enlarged. Uterus is not tender. Cervix exhibits no motion tenderness, no discharge and no friability. No bleeding  in the vagina. Vaginal discharge (small white, thin) found.  Neurological: She is alert and oriented to person, place, and time.  Skin: Skin is warm and dry. No erythema.  Psychiatric: She has a normal mood and affect.  Dilation: Fingertip Effacement (%): Thick Exam by:: Harlon Flor PA   Results for orders placed or performed during the hospital encounter of 12/19/17 (from the past 24 hour(s))  Urinalysis, Routine w reflex microscopic     Status: Abnormal   Collection Time: 12/19/17 12:45 PM  Result Value Ref Range   Color, Urine YELLOW YELLOW   APPearance HAZY (A) CLEAR   Specific Gravity, Urine 1.002 (L) 1.005 - 1.030   pH 7.0 5.0 - 8.0   Glucose, UA NEGATIVE NEGATIVE mg/dL   Hgb  urine dipstick NEGATIVE NEGATIVE   Bilirubin Urine NEGATIVE NEGATIVE   Ketones, ur NEGATIVE NEGATIVE mg/dL   Protein, ur NEGATIVE NEGATIVE mg/dL   Nitrite NEGATIVE NEGATIVE   Leukocytes, UA MODERATE (A) NEGATIVE   RBC / HPF 0-5 0 - 5 RBC/hpf   WBC, UA 0-5 0 - 5 WBC/hpf   Bacteria, UA RARE (A) NONE SEEN   Squamous Epithelial / LPF 0-5 0 - 5  Fetal fibronectin     Status: None   Collection Time: 12/19/17  1:40 PM  Result Value Ref Range   Fetal Fibronectin NEGATIVE NEGATIVE    Fetal Monitoring: Baseline: 130 bpm Variability: moderate Accelerations: 15 x 15 Decelerations: none Contractions: moderate UI, then irregular contractions  Contractions resolved with 2 doses of Procardia 10 mg, 3rd dose withheld   MAU Course  Procedures None  MDM UA and FFN collected today  FFN negative UA without evidence of infection  Patient reports little improvement in pain after contractions resolved, more likely MSK pain in origin. Patient is driving. Will trial Flexeril at home.  Assessment and Plan  A: SIUP at [redacted]w[redacted]d Round ligament pain  Back pain in pregnancy, third trimester   P: Discharge home Rx for Flexeril given to patient  Discussed Tylenol PRN, abdominal binder and hydrotherapy for pain management  Preterm labor precautions discussed Patient advised to follow-up with [facility] as scheduled for routine prenatal care or sooner PRN Patient may return to MAU as needed or if her condition were to change or worsen  Vonzella Nipple, PA-C 12/19/2017, 3:25 PM

## 2017-12-19 NOTE — MAU Note (Signed)
Started contracting 3 days ago.  Pain have gone from the top to the bottom, tightening in her back and upper thighs.  Denies bleeding or leaking.

## 2017-12-26 ENCOUNTER — Telehealth: Payer: Self-pay | Admitting: *Deleted

## 2017-12-26 NOTE — Telephone Encounter (Signed)
Pt left message stating she has questions about "how bedrest is supposed to go." She is having pelvic pain and her feet are swelling. She would like to be put on bedrest due to these problems. Please call back.

## 2017-12-27 ENCOUNTER — Encounter: Payer: Self-pay | Admitting: Advanced Practice Midwife

## 2017-12-31 ENCOUNTER — Other Ambulatory Visit: Payer: Self-pay

## 2017-12-31 ENCOUNTER — Encounter: Payer: Self-pay | Admitting: Advanced Practice Midwife

## 2017-12-31 ENCOUNTER — Ambulatory Visit (INDEPENDENT_AMBULATORY_CARE_PROVIDER_SITE_OTHER): Payer: Medicaid Other | Admitting: Advanced Practice Midwife

## 2017-12-31 VITALS — BP 130/71 | HR 105 | Wt 169.5 lb

## 2017-12-31 DIAGNOSIS — Z3483 Encounter for supervision of other normal pregnancy, third trimester: Secondary | ICD-10-CM

## 2017-12-31 NOTE — Progress Notes (Signed)
   PRENATAL VISIT NOTE  Subjective:  Jill Thomas is a 22 y.o. G3P2002 at 4369w2d being seen today for ongoing prenatal care.  She is currently monitored for the following issues for this low-risk pregnancy and has Supervision of other normal pregnancy, antepartum; Encounter for fetal anatomic survey; and Echogenic intracardiac focus of fetus on prenatal ultrasound on their problem list.  Patient reports backache and lower abdominal pain.  Contractions: Irregular. Vag. Bleeding: None.  Movement: Present. Denies leaking of fluid.   The following portions of the patient's history were reviewed and updated as appropriate: allergies, current medications, past family history, past medical history, past social history, past surgical history and problem list. Problem list updated.  Objective:   Vitals:   12/31/17 0939  BP: 130/71  Pulse: (!) 105  Weight: 169 lb 8 oz (76.9 kg)    Fetal Status: Fetal Heart Rate (bpm): 145 Fundal Height: 33 cm Movement: Present     General:  Alert, oriented and cooperative. Patient is in no acute distress.  Skin: Skin is warm and dry. No rash noted.   Cardiovascular: Normal heart rate noted  Respiratory: Normal respiratory effort, no problems with respiration noted  Abdomen: Soft, gravid, appropriate for gestational age.  Pain/Pressure: Present     Pelvic: Cervical exam deferred        Extremities: Normal range of motion.  Edema: Trace  Mental Status: Normal mood and affect. Normal behavior. Normal judgment and thought content.   Assessment and Plan:  Pregnancy: G3P2002 at 7469w2d  1. Encounter for supervision of other normal pregnancy in third trimester - GBS at next visit   Preterm labor symptoms and general obstetric precautions including but not limited to vaginal bleeding, contractions, leaking of fluid and fetal movement were reviewed in detail with the patient. Please refer to After Visit Summary for other counseling recommendations.  Return in  about 2 weeks (around 01/14/2018).  No future appointments.  Thressa ShellerHeather Chavon Lucarelli, CNM

## 2017-12-31 NOTE — Patient Instructions (Signed)

## 2018-01-02 NOTE — Telephone Encounter (Signed)
Per chart review had ob visit 12/31/17 but I do not see these issues addressed.  I called  [facility] and left a message I was returning her call and if her issues were not addressed at her last visit to call us back.

## 2018-01-16 ENCOUNTER — Other Ambulatory Visit (HOSPITAL_COMMUNITY)
Admission: RE | Admit: 2018-01-16 | Discharge: 2018-01-16 | Disposition: A | Discharge status: Home / Self Care | Payer: Medicaid Other | Source: Ambulatory Visit | Attending: Obstetrics and Gynecology | Admitting: Obstetrics and Gynecology

## 2018-01-16 ENCOUNTER — Ambulatory Visit (INDEPENDENT_AMBULATORY_CARE_PROVIDER_SITE_OTHER): Payer: Medicaid Other | Admitting: Obstetrics and Gynecology

## 2018-01-16 VITALS — BP 120/63 | HR 100 | Wt 171.9 lb

## 2018-01-16 DIAGNOSIS — Z3483 Encounter for supervision of other normal pregnancy, third trimester: Secondary | ICD-10-CM

## 2018-01-16 DIAGNOSIS — Z3480 Encounter for supervision of other normal pregnancy, unspecified trimester: Secondary | ICD-10-CM

## 2018-01-16 NOTE — Progress Notes (Signed)
Pt has been having pain in pelvic area & back feels like something has popped.

## 2018-01-16 NOTE — Patient Instructions (Signed)

## 2018-01-16 NOTE — Progress Notes (Signed)
Subjective:  Jill Thomas is a 22 y.o. G3P2002 at 3224w4d being seen today for ongoing prenatal care.  She is currently monitored for the following issues for this low-risk pregnancy and has Supervision of other normal pregnancy, antepartum; Encounter for fetal anatomic survey; and Echogenic intracardiac focus of fetus on prenatal ultrasound on their problem list.  Patient reports backache (pelvic pain that patient endorses is a 8-9 on a 10pt scale, where 10 is the worse;  Pt also reports that it is made worse when she rolls over while supine, is walking, or goes from being supine to sitting up.). Pt also reports heartburn and occasional contractions (every 5-7 minutes for about 1-2 hours, approximately twice a day)  Contractions: Irregular.every 5-397minutes,  Vag. Bleeding: None.  Movement: Present. Endorses one time occurrence of discharge since last visit that was NOT malodorous, and mucus like in quality.    The following portions of the patient's history were reviewed and updated as appropriate: allergies, current medications, past family history, past medical history, past social history, past surgical history and problem list. Problem list updated.  Objective:   Vitals:   01/16/18 1042  BP: 120/63  Pulse: 100  Weight: 171 lb 14.4 oz (78 kg)    Fetal Status: Fetal Heart Rate (bpm): 152 Fundal Height: 36 cm Movement: Present  Presentation: Vertex  General:  Alert, oriented and cooperative. Patient is in no acute distress.  Skin: Skin is warm and dry. No rash noted.   Cardiovascular: Normal heart rate noted  Respiratory: Normal respiratory effort, no problems with respiration noted  Abdomen: Soft, gravid, appropriate for gestational age. Pain/Pressure: Present     Pelvic: Vag. Bleeding: None Vag D/C Character: Mucous   Cervical exam performed by Dr. Caryl AdaJazma Thomas Dilation: 1 Effacement (%): Thick Station: Ballotable  Extremities: Normal range of motion.  Edema: Trace  Mental Status:  Normal mood and affect. Normal behavior. Normal judgment and thought content.     Assessment and Plan:  Pregnancy: G3P2002 at 7324w4d  1. Supervision of other normal pregnancy, antepartum Pt is progressing through pregnancy well but has several complaints that will be address symptomatically.  Today cultures will be gathered for the following pathogens:  - GC/Chlamydia probe amp ([place] - Culture, beta strep (group b only)  2. Pelvic Pain Advised to take no more than 3500mg  of Tylenol daily (either 1000mg  tablets qtd, or 500mg  tablets q4hours);Pt was also educated about the use of a pregnancy belt if there is no relief of pain   3. Vaginal  Discharge Since this occurred only once since the last visit, even though pt was swabbed for the aforementioned pathogens as part of 36w pregnancy health maintenance, we will  manage this cc expectantly.   Term labor symptoms and general obstetric precautions including but not limited to vaginal bleeding, contractions, leaking of fluid and fetal movement were reviewed in detail with the patient.   Please refer to After Visit Summary for other counseling recommendations.  Return in about 1 week (around 01/23/2018) for ob visit.   Jill Thomas, Jill Thomas, Medical Student

## 2018-01-17 LAB — GC/CHLAMYDIA PROBE AMP (~~LOC~~) NOT AT ARMC
Chlamydia: NEGATIVE
Neisseria Gonorrhea: NEGATIVE

## 2018-01-19 ENCOUNTER — Encounter (HOSPITAL_COMMUNITY): Payer: Self-pay

## 2018-01-19 ENCOUNTER — Inpatient Hospital Stay (HOSPITAL_COMMUNITY)
Admission: AD | Admit: 2018-01-19 | Discharge: 2018-01-20 | Disposition: A | Discharge status: Home / Self Care | Payer: Medicaid Other | Source: Ambulatory Visit | Attending: Obstetrics & Gynecology | Admitting: Obstetrics & Gynecology

## 2018-01-19 DIAGNOSIS — Z79899 Other long term (current) drug therapy: Secondary | ICD-10-CM | POA: Insufficient documentation

## 2018-01-19 DIAGNOSIS — O4703 False labor before 37 completed weeks of gestation, third trimester: Secondary | ICD-10-CM | POA: Insufficient documentation

## 2018-01-19 DIAGNOSIS — Z3A33 33 weeks gestation of pregnancy: Secondary | ICD-10-CM

## 2018-01-19 DIAGNOSIS — O212 Late vomiting of pregnancy: Secondary | ICD-10-CM | POA: Diagnosis present

## 2018-01-19 DIAGNOSIS — Z8249 Family history of ischemic heart disease and other diseases of the circulatory system: Secondary | ICD-10-CM | POA: Insufficient documentation

## 2018-01-19 DIAGNOSIS — Z3A36 36 weeks gestation of pregnancy: Secondary | ICD-10-CM | POA: Insufficient documentation

## 2018-01-19 DIAGNOSIS — O479 False labor, unspecified: Secondary | ICD-10-CM

## 2018-01-19 DIAGNOSIS — Z800 Family history of malignant neoplasm of digestive organs: Secondary | ICD-10-CM | POA: Insufficient documentation

## 2018-01-19 DIAGNOSIS — O219 Vomiting of pregnancy, unspecified: Secondary | ICD-10-CM

## 2018-01-19 LAB — URINALYSIS, ROUTINE W REFLEX MICROSCOPIC
Bilirubin Urine: NEGATIVE
Glucose, UA: NEGATIVE mg/dL
Hgb urine dipstick: NEGATIVE
Ketones, ur: 20 mg/dL — AB
Leukocytes, UA: NEGATIVE
Nitrite: NEGATIVE
PH: 6 (ref 5.0–8.0)
Protein, ur: 30 mg/dL — AB
SPECIFIC GRAVITY, URINE: 1.023 (ref 1.005–1.030)

## 2018-01-19 MED ORDER — ONDANSETRON 8 MG PO TBDP
8.0000 mg | ORAL_TABLET | Freq: Once | ORAL | Status: AC
  Administered 2018-01-19: 8 mg via ORAL
  Filled 2018-01-19: qty 1

## 2018-01-19 NOTE — MAU Note (Signed)
Discussed with Sharen CounterLisa Leftwich-Kirby, CNM to assess patient following receiving results from urinalysis.  Patient reports she has been unable to keep any fluids down all day.  CNM states she will see the patient to evaluate for dehydration and nausea.

## 2018-01-19 NOTE — MAU Provider Note (Signed)
Chief Complaint:  Labor Eval   First Provider Initiated Contact with Patient 01/19/18 2344      HPI: Jill Thomas is a 22 y.o. G3P2002 at 3327w0d [facility] who presents to maternity admissions initially for labor evaluation reporting cramping abdominal and back pain and pelvic pressure.  She also reported nausea with vomiting x 2 in 24 hours as an associated symptom.  She reports she cannot keep fluids down so is unable to increase her PO fluids at home. The pain is low in her abdomen and radiates around to her low back. Onset was this morning and it has been irregular but worsening all day today.  The pain started first, followed by nausea and emesis x 2.  She also reports urinary frequency and some urgency but no dysuria. There are no other associated symptoms. She has not tried any treatments. She reports good fetal movement, denies LOF, vaginal bleeding, fever/chills.     HPI  Past Medical History: Past Medical History:  Diagnosis Date  . Headache   . Medical history non-contributory     Past obstetric history: OB History  Gravida Para Term Preterm AB Living  3 2 2  0 0 2  SAB TAB Ectopic Multiple Live Births  0 0 0 0 2    # Outcome Date GA Lbr Len/2nd Weight Sex Delivery Anes PTL Lv  3 Current           2 Term 06/09/15 4475w0d   M Vag-Spont  N LIV  1 Term 04/24/14 6575w0d 09:05 / 01:20 6 lb 3.5 oz (2.82 kg) M Vag-Spont EPI  LIV     Birth Comments: WNL    Past Surgical History: Past Surgical History:  Procedure Laterality Date  . NO PAST SURGERIES      Family History: Family History  Problem Relation Age of Onset  . Cancer Maternal Grandfather        pancreatic  . Hypertension Maternal Grandmother     Social History: Social History   Tobacco Use  . Smoking status: Former Smoker    Types: Cigarettes  . Smokeless tobacco: Never Used  . Tobacco comment: quit May 2019  Substance Use Topics  . Alcohol use: No  . Drug use: No    Allergies: No Known  Allergies  Meds:  Facility-Administered Medications Prior to Admission  Medication Dose Route Frequency Provider Last Rate Last Dose  . cetirizine (ZYRTEC) tablet 10 mg  10 mg Oral Daily Degele, Kandra NicolasJulie P, MD       Medications Prior to Admission  Medication Sig Dispense Refill Last Dose  . acetaminophen (TYLENOL) 500 MG tablet Take 500 mg by mouth every 6 (six) hours as needed for mild pain or headache.   Taking  . cyclobenzaprine (FLEXERIL) 10 MG tablet Take 1 tablet (10 mg total) by mouth 2 (two) times daily as needed for muscle spasms. (Patient not taking: Reported on 01/16/2018) 20 tablet 0 Not Taking  . Prenatal Multivit-Min-Fe-FA (PRENATAL VITAMINS) 0.8 MG tablet Take 1 tablet by mouth daily. (Patient not taking: Reported on 12/19/2017) 30 tablet 12 Not Taking    ROS:  Review of Systems  Constitutional: Negative for chills, fatigue and fever.  Eyes: Negative for visual disturbance.  Respiratory: Negative for shortness of breath.   Cardiovascular: Negative for chest pain.  Gastrointestinal: Positive for abdominal pain, nausea and vomiting.  Genitourinary: Positive for pelvic pain. Negative for difficulty urinating, dysuria, flank pain, vaginal bleeding, vaginal discharge and vaginal pain.  Musculoskeletal: Positive for back pain.  Neurological: Negative for dizziness and headaches.  Psychiatric/Behavioral: Negative.      I have reviewed patient's Past Medical Hx, Surgical Hx, Family Hx, Social Hx, medications and allergies.   Physical Exam   Patient Vitals for the past 24 hrs:  BP Temp Pulse Resp SpO2 Height Weight  01/19/18 2205 125/69 98.1 F (36.7 C) (!) 101 19 100 % 5' (1.524 m) 173 lb 12 oz (78.8 kg)   Constitutional: Well-developed, well-nourished female in no acute distress.  Cardiovascular: normal rate Respiratory: normal effort GI: Abd soft, non-tender, gravid appropriate for gestational age.  MS: Extremities nontender, no edema, normal ROM Neurologic: Alert and  oriented x 4.  GU: Neg CVAT.   Dilation: 1 Effacement (%): Thick Station: -3 Presentation: Vertex Exam by:: Latricia Heft, RN  FHT:  Baseline 140 , moderate variability, accelerations present, no decelerations Contractions: rare, mild to palpation   Labs: Results for orders placed or performed during the hospital encounter of 01/19/18 (from the past 24 hour(s))  Urinalysis, Routine w reflex microscopic     Status: Abnormal   Collection Time: 01/19/18 10:50 PM  Result Value Ref Range   Color, Urine YELLOW YELLOW   APPearance HAZY (A) CLEAR   Specific Gravity, Urine 1.023 1.005 - 1.030   pH 6.0 5.0 - 8.0   Glucose, UA NEGATIVE NEGATIVE mg/dL   Hgb urine dipstick NEGATIVE NEGATIVE   Bilirubin Urine NEGATIVE NEGATIVE   Ketones, ur 20 (A) NEGATIVE mg/dL   Protein, ur 30 (A) NEGATIVE mg/dL   Nitrite NEGATIVE NEGATIVE   Leukocytes, UA NEGATIVE NEGATIVE   RBC / HPF 0-5 0 - 5 RBC/hpf   WBC, UA 0-5 0 - 5 WBC/hpf   Bacteria, UA RARE (A) NONE SEEN   Squamous Epithelial / LPF 6-10 0 - 5   Mucus PRESENT    Ca Oxalate Crys, UA PRESENT    A/Positive/-- (01/28 1610)  Imaging:  No results found.  MAU Course/MDM: I have ordered labs and reviewed results.  NST reviewed and reactive No evidence of labor on exam by RN UA indicates mild dehydration with ketones Pt unable to drink PO fluids due to nausea so Zofran 8 mg ODT x 1 dose in MAU, pt tolerated pitcher of water in MAU D/C home Keep scheduled appts in [facility] office Return to MAU for signs of labor or emergencies Pt discharge with strict labor precautions.   Assessment: 1. Nausea and vomiting in pregnancy   2. Braxton Hicks contractions     Plan: Discharge home Labor precautions and fetal kick counts Follow-up Information    [facility].   Specialty:  Obstetrics and Gynecology Why:  Return to MAU as needed for emergencies. Contact information: [address] [place]  [place] [address] [phone]         Allergies as of 01/20/2018   No Known Allergies     Medication List    TAKE these medications   acetaminophen 500 MG tablet Commonly known as:  TYLENOL Take 500 mg by mouth every 6 (six) hours as needed for mild pain or headache.   cyclobenzaprine 10 MG tablet Commonly known as:  FLEXERIL Take 1 tablet (10 mg total) by mouth 2 (two) times daily as needed for muscle spasms.   ondansetron 4 MG disintegrating tablet Commonly known as:  ZOFRAN ODT Take 1 tablet (4 mg total) by mouth every 6 (six) hours as needed for nausea.   Prenatal Vitamins  0.8 MG tablet Take 1 tablet by mouth daily.       Sharen Counter Certified Nurse-Midwife 01/20/2018 12:32 AM

## 2018-01-19 NOTE — MAU Note (Signed)
States she has been feeling nauseous and light headed all day.  Reporting irregular ctx with a lot of back pain and pelvic pressure.  No LOF/VB.  +FM.

## 2018-01-20 LAB — CULTURE, BETA STREP (GROUP B ONLY): Strep Gp B Culture: NEGATIVE

## 2018-01-20 MED ORDER — ONDANSETRON 4 MG PO TBDP: 4.0000 mg | ORAL_TABLET | Freq: Four times a day (QID) | ORAL | 0 refills | Status: DC | PRN

## 2018-01-20 NOTE — Discharge Instructions (Signed)

## 2018-01-21 ENCOUNTER — Ambulatory Visit (INDEPENDENT_AMBULATORY_CARE_PROVIDER_SITE_OTHER): Payer: Medicaid Other | Admitting: Advanced Practice Midwife

## 2018-01-21 ENCOUNTER — Encounter: Payer: Self-pay | Admitting: Advanced Practice Midwife

## 2018-01-21 VITALS — BP 111/61 | HR 77 | Wt 172.7 lb

## 2018-01-21 DIAGNOSIS — Z3480 Encounter for supervision of other normal pregnancy, unspecified trimester: Secondary | ICD-10-CM

## 2018-01-21 DIAGNOSIS — Z3483 Encounter for supervision of other normal pregnancy, third trimester: Secondary | ICD-10-CM

## 2018-01-21 NOTE — Progress Notes (Signed)
   PRENATAL VISIT NOTE  Subjective:  Jill Thomas is a 22 y.o. G3P2002 at 4571w2d being seen today for ongoing prenatal care.  She is currently monitored for the following issues for this low-risk pregnancy and has Supervision of other normal pregnancy, antepartum and Echogenic intracardiac focus of fetus on prenatal ultrasound on their problem list.  Patient reports no complaints.  Contractions: Irregular. Vag. Bleeding: None.  Movement: Present. Denies leaking of fluid.   The following portions of the patient's history were reviewed and updated as appropriate: allergies, current medications, past family history, past medical history, past social history, past surgical history and problem list. Problem list updated.  Objective:   Vitals:   01/21/18 1012  BP: 111/61  Pulse: 77  Weight: 172 lb 11.2 oz (78.3 kg)    Fetal Status: Fetal Heart Rate (bpm): 133 Fundal Height: 37 cm Movement: Present  Presentation: Vertex  General:  Alert, oriented and cooperative. Patient is in no acute distress.  Skin: Skin is warm and dry. No rash noted.   Cardiovascular: Normal heart rate noted  Respiratory: Normal respiratory effort, no problems with respiration noted  Abdomen: Soft, gravid, appropriate for gestational age.  Pain/Pressure: Present     Pelvic: Cervical exam performed Dilation: 1 Effacement (%): Thick Station: -3  Extremities: Normal range of motion.  Edema: Trace  Mental Status: Normal mood and affect. Normal behavior. Normal judgment and thought content.   Assessment and Plan:  Pregnancy: G3P2002 at 4071w2d  1. Supervision of other normal pregnancy, antepartum - Routine care  Preterm labor symptoms and general obstetric precautions including but not limited to vaginal bleeding, contractions, leaking of fluid and fetal movement were reviewed in detail with the patient. Please refer to After Visit Summary for other counseling recommendations.  Return in about 1 week (around  01/28/2018).  Future Appointments  Date Time Provider Department Center  01/29/2018  8:55 AM [facility]Lawrence[facility] Erin[facility]  02/04/2018  8:55 AM Judeth HornLawrence, Erin, [facility]    Thressa ShellerHeather Keyron Pokorski, CNM

## 2018-01-21 NOTE — Patient Instructions (Addendum)
BENEFITS OF BREASTFEEDING Many women wonder if they should breastfeed. Research shows that breast milk contains the perfect balance of vitamins, protein and fat that your baby needs to grow. It also contains antibodies that help your baby's immune system to fight off viruses and bacteria and can reduce the risk of sudden infant death syndrome (SIDS). In addition, the colostrum (a fluid secreted from the breast in the first few days after delivery) helps your newborn's digestive system to grow and function well. Breast milk is easier to digest than formula. Also, if your baby is born preterm, breast milk can help to reduce both short- and long-term health problems. BENEFITS OF BREASTFEEDING FOR MOM . Breastfeeding causes a hormone to be released that helps the uterus to contract and return to its normal size more quickly. . It aids in postpartum weight loss, reduces risk of breast and ovarian cancer, heart disease and rheumatoid arthritis. . It decreases the amount of bleeding after the baby is born. benefits of breastfeeding for baby . Provides comfort and nutrition . Protects baby against - Obesity - Diabetes - Asthma - Childhood cancers - Heart disease - Ear infections - Diarrhea - Pneumonia - Stomach problems - Serious allergies - Skin rashes . Promotes growth and development . Reduces the risk of baby having Sudden Infant Death Syndrome (SIDS) only breastmilk for the first 6 months . Protects baby against diseases/allergies . It's the perfect amount for tiny bellies . It restores baby's energy . Provides the best nutrition for baby . Giving water or formula can make baby more likely to get sick, decrease Mom's milk supply, make baby less content with breastfeeding Skin to Skin After delivery, the staff will place your baby on your chest. This helps with the following: . Regulates baby's temperature, breathing, heart rate and blood sugar . Increases Mom's milk supply . Promotes  bonding . Keeps baby and Mom calm and decreases baby's crying Rooming In Your baby will stay in your room with you for the entire time you are in the hospital. This helps with the following: . Allows Mom to learn baby's feeding cues - Fluttering eyes - Sucking on tongue or hand - Rooting (opens mouth and turns head) - Nuzzling into the breast - Bringing hand to mouth . Allows breastfeeding on demand (when your baby is ready) . Helps baby to be calm and content . Ensures a good milk supply . Prevents complications with breastfeeding . Allows parents to learn to care for baby . Allows you to request assistance with breastfeeding Importance of a good latch . Increases milk transfer to baby - baby gets enough milk . Ensures you have enough milk for your baby . Decreases nipple soreness . Don't use pacifiers and bottles - these cause baby to suck differently than breastfeeding . Promotes continuation of breastfeeding Risks of Formula Supplementation with Breastfeeding Giving your infant formula in addition to your breast-milk EXCEPT when medically necessary can lead to: . Decreases your milk supply  . Loss of confidence in yourself for providing baby's nutrition  . Engorgement and possibly mastitis  . Asthma & allergies in the baby BREASTFEEDING FAQS How long should I breastfeed my baby? It is recommended that you provide your baby with breast milk only for the first 6 months and then continue for the first year and longer as desired. During the first few weeks after birth, your baby will need to feed 8-12 times every 24 hours, or every 2-3 hours. They will likely feed   for 15-30 minutes. How can I help my baby begin breastfeeding? Babies are born with an instinct to breastfeed. A healthy baby can begin breastfeeding right away without specific help. At the hospital, a nurse (or lactation consultant) will help you begin the process and will give you tips on good positioning. It may be  helpful to take a breastfeeding class before you deliver in order to know what to expect. How can I help my baby latch on? In order to assist your baby in latching-on, cup your breast in your hand and stroke your baby's lower lip with your nipple to stimulate your baby's rooting reflex. Your baby will look like he or she is yawning, at which point you should bring the baby towards your breast, while aiming the nipple at the roof of his or her mouth. Remember to bring the baby towards you and not your breast towards the baby. How can I tell if my baby is latched-on? Your baby will have all of your nipple and part of the dark area around the nipple in his or her mouth and your baby's nose will be touching your breast. You should see or hear the baby swallowing. If the baby is not latched-on properly, start the process over. To remove the suction, insert a clean finger between your breast and the baby's mouth. Should I switch breasts during feeding? After feeding on one side, switch the baby to your other breast. If he or she does not continue feeding - that is OK. Your baby will not necessarily need to feed from both breasts in a single feeding. On the next feeding, start with the other breast for efficiency and comfort. How can I tell if my baby is hungry? When your baby is hungry, they will nuzzle against your breast, make sucking noises and tongue motions and may put their hands near their mouth. Crying is a late sign of hunger, so you should not wait until this point. When they have received enough milk, they will unlatch from the breast. Is it okay to use a pacifier? Until your baby gets the hang of breastfeeding, experts recommend limiting pacifier usage. If you have questions about this, please contact your pediatrician. What can I do to ensure proper nutrition while breastfeeding? . Make sure that you support your own health and your baby's by eating a healthy, well-balanced diet . Your provider  may recommend that you continue to take your prenatal vitamin . Drink plenty of fluids. It is a good rule to drink one glass of water before or after feeding . Alcohol will remain in the breast milk for as long as it will remain in the blood stream. If you choose to have a drink, it is recommended that you wait at least 2 hours before feeding . Moderate amounts of caffeine are OK . Some over-the-counter or prescription medications are not recommended during breastfeeding. Check with your provider if you have questions What types of birth control methods are safe while breastfeeding? Progestin-only methods, including a daily pill, an IUD, the implant and the injection are safe while breastfeeding. Methods that contain estrogen (such as combination birth control pills, the vaginal ring and the patch) should not be used during the first month of breastfeeding as these can decrease your milk supply.  Contraception Choices Contraception, also called birth control, refers to methods or devices that prevent pregnancy. Hormonal methods Contraceptive implant A contraceptive implant is a thin, plastic tube that contains a hormone. It is inserted  into the upper part of the arm. It can remain in place for up to 3 years. Progestin-only injections Progestin-only injections are injections of progestin, a synthetic form of the hormone progesterone. They are given every 3 months by a health care provider. Birth control pills Birth control pills are pills that contain hormones that prevent pregnancy. They must be taken once a day, preferably at the same time each day. Birth control patch The birth control patch contains hormones that prevent pregnancy. It is placed on the skin and must be changed once a week for three weeks and removed on the fourth week. A prescription is needed to use this method of contraception. Vaginal ring A vaginal ring contains hormones that prevent pregnancy. It is placed in the vagina  for three weeks and removed on the fourth week. After that, the process is repeated with a new ring. A prescription is needed to use this method of contraception. Emergency contraceptive Emergency contraceptives prevent pregnancy after unprotected sex. They come in pill form and can be taken up to 5 days after sex. They work best the sooner they are taken after having sex. Most emergency contraceptives are available without a prescription. This method should not be used as your only form of birth control. Barrier methods Female condom A female condom is a thin sheath that is worn over the penis during sex. Condoms keep sperm from going inside a woman's body. They can be used with a spermicide to increase their effectiveness. They should be disposed after a single use. Female condom A female condom is a soft, loose-fitting sheath that is put into the vagina before sex. The condom keeps sperm from going inside a woman's body. They should be disposed after a single use. Diaphragm A diaphragm is a soft, dome-shaped barrier. It is inserted into the vagina before sex, along with a spermicide. The diaphragm blocks sperm from entering the uterus, and the spermicide kills sperm. A diaphragm should be left in the vagina for 6-8 hours after sex and removed within 24 hours. A diaphragm is prescribed and fitted by a health care provider. A diaphragm should be replaced every 1-2 years, after giving birth, after gaining more than 15 lb (6.8 kg), and after pelvic surgery. Cervical cap A cervical cap is a round, soft latex or plastic cup that fits over the cervix. It is inserted into the vagina before sex, along with spermicide. It blocks sperm from entering the uterus. The cap should be left in place for 6-8 hours after sex and removed within 48 hours. A cervical cap must be prescribed and fitted by a health care provider. It should be replaced every 2 years. Sponge A sponge is a soft, circular piece of polyurethane foam  with spermicide on it. The sponge helps block sperm from entering the uterus, and the spermicide kills sperm. To use it, you make it wet and then insert it into the vagina. It should be inserted before sex, left in for at least 6 hours after sex, and removed and thrown away within 30 hours. Spermicides Spermicides are chemicals that kill or block sperm from entering the cervix and uterus. They can come as a cream, jelly, suppository, foam, or tablet. A spermicide should be inserted into the vagina with an applicator at least 10-15 minutes before sex to allow time for it to work. The process must be repeated every time you have sex. Spermicides do not require a prescription. Intrauterine contraception Intrauterine device (IUD) An IUD is a  T-shaped device that is put in a woman's uterus. There are two types:  Hormone IUD.This type contains progestin, a synthetic form of the hormone progesterone. This type can stay in place for 3-5 years.  Copper IUD.This type is wrapped in copper wire. It can stay in place for 10 years.  Permanent methods of contraception Female tubal ligation In this method, a woman's fallopian tubes are sealed, tied, or blocked during surgery to prevent eggs from traveling to the uterus. Hysteroscopic sterilization In this method, a small, flexible insert is placed into each fallopian tube. The inserts cause scar tissue to form in the fallopian tubes and block them, so sperm cannot reach an egg. The procedure takes about 3 months to be effective. Another form of birth control must be used during those 3 months. Female sterilization This is a procedure to tie off the tubes that carry sperm (vasectomy). After the procedure, the man can still ejaculate fluid (semen). Natural planning methods Natural family planning In this method, a couple does not have sex on days when the woman could become pregnant. Calendar method This means keeping track of the length of each menstrual cycle,  identifying the days when pregnancy can happen, and not having sex on those days. Ovulation method In this method, a couple avoids sex during ovulation. Symptothermal method This method involves not having sex during ovulation. The woman typically checks for ovulation by watching changes in her temperature and in the consistency of cervical mucus. Post-ovulation method In this method, a couple waits to have sex until after ovulation. Summary  Contraception, also called birth control, means methods or devices that prevent pregnancy.  Hormonal methods of contraception include implants, injections, pills, patches, vaginal rings, and emergency contraceptives.  Barrier methods of contraception can include female condoms, female condoms, diaphragms, cervical caps, sponges, and spermicides.  There are two types of IUDs (intrauterine devices). An IUD can be put in a woman's uterus to prevent pregnancy for 3-5 years.  Permanent sterilization can be done through a procedure for males, females, or both.  Natural family planning methods involve not having sex on days when the woman could become pregnant. This information is not intended to replace advice given to you by your health care provider. Make sure you discuss any questions you have with your health care provider. Document Released: 06/26/2005 Document Revised: 07/29/2016 Document Reviewed: 07/29/2016 [employer] Interactive Patient Education  2018 [employer]

## 2018-01-29 ENCOUNTER — Inpatient Hospital Stay (EMERGENCY_DEPARTMENT_HOSPITAL)
Admission: AD | Admit: 2018-01-29 | Discharge: 2018-01-29 | Disposition: A | Discharge status: Home / Self Care | Payer: Medicaid Other | Source: Ambulatory Visit | Attending: Obstetrics and Gynecology | Admitting: Obstetrics and Gynecology

## 2018-01-29 ENCOUNTER — Ambulatory Visit (INDEPENDENT_AMBULATORY_CARE_PROVIDER_SITE_OTHER): Payer: Medicaid Other | Admitting: Student

## 2018-01-29 ENCOUNTER — Inpatient Hospital Stay (HOSPITAL_COMMUNITY)
Admission: AD | Admit: 2018-01-29 | Discharge: 2018-01-29 | Disposition: A | Discharge status: Home / Self Care | Payer: Medicaid Other | Source: Ambulatory Visit | Attending: Obstetrics and Gynecology | Admitting: Obstetrics and Gynecology

## 2018-01-29 ENCOUNTER — Encounter (HOSPITAL_COMMUNITY): Payer: Self-pay | Admitting: *Deleted

## 2018-01-29 ENCOUNTER — Other Ambulatory Visit: Payer: Self-pay

## 2018-01-29 VITALS — BP 127/67 | HR 91 | Wt 172.9 lb

## 2018-01-29 DIAGNOSIS — O26893 Other specified pregnancy related conditions, third trimester: Secondary | ICD-10-CM | POA: Insufficient documentation

## 2018-01-29 DIAGNOSIS — O368130 Decreased fetal movements, third trimester, not applicable or unspecified: Secondary | ICD-10-CM

## 2018-01-29 DIAGNOSIS — O479 False labor, unspecified: Secondary | ICD-10-CM

## 2018-01-29 DIAGNOSIS — Z3A37 37 weeks gestation of pregnancy: Secondary | ICD-10-CM

## 2018-01-29 DIAGNOSIS — R51 Headache: Secondary | ICD-10-CM | POA: Diagnosis not present

## 2018-01-29 DIAGNOSIS — Z8249 Family history of ischemic heart disease and other diseases of the circulatory system: Secondary | ICD-10-CM | POA: Insufficient documentation

## 2018-01-29 DIAGNOSIS — Z87891 Personal history of nicotine dependence: Secondary | ICD-10-CM | POA: Insufficient documentation

## 2018-01-29 DIAGNOSIS — Z3480 Encounter for supervision of other normal pregnancy, unspecified trimester: Secondary | ICD-10-CM

## 2018-01-29 DIAGNOSIS — Z3689 Encounter for other specified antenatal screening: Secondary | ICD-10-CM

## 2018-01-29 NOTE — Progress Notes (Signed)
Subjective:  Marijean HeathShanice L Hietala is a 22 y.o. G3P2002 at 646w3d being seen today for ongoing prenatal care.  She is currently monitored for the following issues for this low-risk pregnancy and has Supervision of other normal pregnancy, antepartum and Echogenic intracardiac focus of fetus on prenatal ultrasound on their problem list.  Patient reports contractions for the past 2 hours. She reports left thigh numbness and sharp back pains accompanied with the contractions. She further reports DFM.  Contractions: Irregular. Vag. Bleeding: None.  Movement: (!) Decreased. Denies leaking of fluid.   The following portions of the patient's history were reviewed and updated as appropriate: allergies, current medications, past family history, past medical history, past social history, past surgical history and problem list. Problem list updated.  Objective:   Vitals:   01/29/18 0910  BP: 127/67  Pulse: 91  Weight: 172 lb 14.4 oz (78.4 kg)    Fetal Status: Fetal Heart Rate (bpm): 148 Fundal Height: 37 cm Movement: (!) Decreased  Presentation: Vertex  General:  Alert, oriented and cooperative. Patient is in no acute distress.  Skin: Skin is warm and dry. No rash noted.   Cardiovascular: Normal heart rate noted  Respiratory: Normal respiratory effort, no problems with respiration noted  Abdomen: Soft, gravid, appropriate for gestational age. Pain/Pressure: Present     Pelvic: Vag. Bleeding: None Vag D/C Character: Mucous   Cervical exam performed Dilation: 1 Effacement (%): 60 Station: -3  Extremities: Normal range of motion.  Edema: Trace  Mental Status: Normal mood and affect. Normal behavior. Normal judgment and thought content.    Assessment and Plan:  Pregnancy: G3P2002 at 4246w3d  1. Supervision of other normal pregnancy, antepartum  -Patient made aware of different methods of contraception but remains  unsure about method at this time  -Discussed receiving NST versus [facility] labor evaluation  given her contractions and DFM -Patient to go to [facility] for DFM and labor check   Term labor symptoms and general obstetric precautions including but not limited to vaginal bleeding, contractions, leaking of fluid and fetal movement were reviewed in detail with the patient. Please refer to After Visit Summary for other counseling recommendations.  Return in about 1 week (around 02/05/2018) for Routine OB.   Candie MileMitchell, Rockland Kotarski R, [place]

## 2018-01-29 NOTE — Discharge Instructions (Signed)
Vaginal Delivery Vaginal delivery means that you will give birth by pushing your baby out of your birth canal (vagina). A team of health care providers will help you before, during, and after vaginal delivery. Birth experiences are unique for every woman and every pregnancy, and birth experiences vary depending on where you choose to give birth. What should I do to prepare for my baby's birth? Before your baby is born, it is important to talk with your health care provider about:  Your labor and delivery preferences. These may include: ? Medicines that you may be given. ? How you will manage your pain. This might include non-medical pain relief techniques or injectable pain relief such as epidural analgesia. ? How you and your baby will be monitored during labor and delivery. ? Who may be in the labor and delivery room with you. ? Your feelings about surgical delivery of your baby (cesarean delivery, or C-section) if this becomes necessary. ? Your feelings about receiving donated blood through an IV tube (blood transfusion) if this becomes necessary.  Whether you are able: ? To take pictures or videos of the birth. ? To eat during labor and delivery. ? To move around, walk, or change positions during labor and delivery.  What to expect after your baby is born, such as: ? Whether delayed umbilical cord clamping and cutting is offered. ? Who will care for your baby right after birth. ? Medicines or tests that may be recommended for your baby. ? Whether breastfeeding is supported in your hospital or birth center. ? How long you will be in the hospital or birth center.  How any medical conditions you have may affect your baby or your labor and delivery experience.  To prepare for your baby's birth, you should also:  Attend all of your health care visits before delivery (prenatal visits) as recommended by your health care provider. This is important.  Prepare your home for your baby's  arrival. Make sure that you have: ? Diapers. ? Baby clothing. ? Feeding equipment. ? Safe sleeping arrangements for you and your baby.  Install a car seat in your vehicle. Have your car seat checked by a certified car seat installer to make sure that it is installed safely.  Think about who will help you with your new baby at home for at least the first several weeks after delivery.  What can I expect when I arrive at the birth center or hospital? Once you are in labor and have been admitted into the hospital or birth center, your health care provider may:  Review your pregnancy history and any concerns you have.  Insert an IV tube into one of your veins. This is used to give you fluids and medicines.  Check your blood pressure, pulse, temperature, and heart rate (vital signs).  Check whether your bag of water (amniotic sac) has broken (ruptured).  Talk with you about your birth plan and discuss pain control options.  Monitoring Your health care provider may monitor your contractions (uterine monitoring) and your baby's heart rate (fetal monitoring). You may need to be monitored:  Often, but not continuously (intermittently).  All the time or for long periods at a time (continuously). Continuous monitoring may be needed if: ? You are taking certain medicines, such as medicine to relieve pain or make your contractions stronger. ? You have pregnancy or labor complications.  Monitoring may be done by:  Placing a special stethoscope or a handheld monitoring device on your abdomen to   check your baby's heartbeat, and feeling your abdomen for contractions. This method of monitoring does not continuously record your baby's heartbeat or your contractions.  Placing monitors on your abdomen (external monitors) to record your baby's heartbeat and the frequency and length of contractions. You may not have to wear external monitors all the time.  Placing monitors inside of your uterus  (internal monitors) to record your baby's heartbeat and the frequency, length, and strength of your contractions. ? Your health care provider may use internal monitors if he or she needs more information about the strength of your contractions or your baby's heart rate. ? Internal monitors are put in place by passing a thin, flexible wire through your vagina and into your uterus. Depending on the type of monitor, it may remain in your uterus or on your baby's head until birth. ? Your health care provider will discuss the benefits and risks of internal monitoring with you and will ask for your permission before inserting the monitors.  Telemetry. This is a type of continuous monitoring that can be done with external or internal monitors. Instead of having to stay in bed, you are able to move around during telemetry. Ask your health care provider if telemetry is an option for you.  Physical exam Your health care provider may perform a physical exam. This may include:  Checking whether your baby is positioned: ? With the head toward your vagina (head-down). This is most common. ? With the head toward the top of your uterus (head-up or breech). If your baby is in a breech position, your health care provider may try to turn your baby to a head-down position so you can deliver vaginally. If it does not seem that your baby can be born vaginally, your provider may recommend surgery to deliver your baby. In rare cases, you may be able to deliver vaginally if your baby is head-up (breech delivery). ? Lying sideways (transverse). Babies that are lying sideways cannot be delivered vaginally.  Checking your cervix to determine: ? Whether it is thinning out (effacing). ? Whether it is opening up (dilating). ? How low your baby has moved into your birth canal.  What are the three stages of labor and delivery?  Normal labor and delivery is divided into the following three stages: Stage 1  Stage 1 is the  longest stage of labor, and it can last for hours or days. Stage 1 includes: ? Early labor. This is when contractions may be irregular, or regular and mild. Generally, early labor contractions are more than 10 minutes apart. ? Active labor. This is when contractions get longer, more regular, more frequent, and more intense. ? The transition phase. This is when contractions happen very close together, are very intense, and may last longer than during any other part of labor.  Contractions generally feel mild, infrequent, and irregular at first. They get stronger, more frequent (about every 2-3 minutes), and more regular as you progress from early labor through active labor and transition.  Many women progress through stage 1 naturally, but you may need help to continue making progress. If this happens, your health care provider may talk with you about: ? Rupturing your amniotic sac if it has not ruptured yet. ? Giving you medicine to help make your contractions stronger and more frequent.  Stage 1 ends when your cervix is completely dilated to 4 inches (10 cm) and completely effaced. This happens at the end of the transition phase. Stage 2  Once   your cervix is completely effaced and dilated to 4 inches (10 cm), you may start to feel an urge to push. It is common for the body to naturally take a rest before feeling the urge to push, especially if you received an epidural or certain other pain medicines. This rest period may last for up to 1-2 hours, depending on your unique labor experience.  During stage 2, contractions are generally less painful, because pushing helps relieve contraction pain. Instead of contraction pain, you may feel stretching and burning pain, especially when the widest part of your baby's head passes through the vaginal opening (crowning).  Your health care provider will closely monitor your pushing progress and your baby's progress through the vagina during stage 2.  Your  health care provider may massage the area of skin between your vaginal opening and anus (perineum) or apply warm compresses to your perineum. This helps it stretch as the baby's head starts to crown, which can help prevent perineal tearing. ? In some cases, an incision may be made in your perineum (episiotomy) to allow the baby to pass through the vaginal opening. An episiotomy helps to make the opening of the vagina larger to allow more room for the baby to fit through.  It is very important to breathe and focus so your health care provider can control the delivery of your baby's head. Your health care provider may have you decrease the intensity of your pushing, to help prevent perineal tearing.  After delivery of your baby's head, the shoulders and the rest of the body generally deliver very quickly and without difficulty.  Once your baby is delivered, the umbilical cord may be cut right away, or this may be delayed for 1-2 minutes, depending on your baby's health. This may vary among health care providers, hospitals, and birth centers.  If you and your baby are healthy enough, your baby may be placed on your chest or abdomen to help maintain the baby's temperature and to help you bond with each other. Some mothers and babies start breastfeeding at this time. Your health care team will dry your baby and help keep your baby warm during this time.  Your baby may need immediate care if he or she: ? Showed signs of distress during labor. ? Has a medical condition. ? Was born too early (prematurely). ? Had a bowel movement before birth (meconium). ? Shows signs of difficulty transitioning from being inside the uterus to being outside of the uterus. If you are planning to breastfeed, your health care team will help you begin a feeding. Stage 3  The third stage of labor starts immediately after the birth of your baby and ends after you deliver the placenta. The placenta is an organ that develops  during pregnancy to provide oxygen and nutrients to your baby in the womb.  Delivering the placenta may require some pushing, and you may have mild contractions. Breastfeeding can stimulate contractions to help you deliver the placenta.  After the placenta is delivered, your uterus should tighten (contract) and become firm. This helps to stop bleeding in your uterus. To help your uterus contract and to control bleeding, your health care provider may: ? Give you medicine by injection, through an IV tube, by mouth, or through your rectum (rectally). ? Massage your abdomen or perform a vaginal exam to remove any blood clots that are left in your uterus. ? Empty your bladder by placing a thin, flexible tube (catheter) into your bladder. ? Encourage   you to breastfeed your baby. After labor is over, you and your baby will be monitored closely to ensure that you are both healthy until you are ready to go home. Your health care team will teach you how to care for yourself and your baby. This information is not intended to replace advice given to you by your health care provider. Make sure you discuss any questions you have with your health care provider. Document Released: 04/04/2008 Document Revised: 01/14/2016 Document Reviewed: 07/11/2015 Elsevier Interactive Patient Education  2018 [employer]  

## 2018-01-29 NOTE — MAU Provider Note (Addendum)
History     CSN: [id number]  Arrival date and time: 01/29/18 [id number]   First Provider Initiated Contact with Patient 01/29/18 1012      Chief Complaint  Patient presents with  . Decreased Fetal Movement  . Contractions   HPI  Jill Thomas is a 22 y.o. G3P2002 at 1621w3d who [facility] with chief complaint of decreased fetal movement and worsening abdominal contractions. Pain is existing problem for past week, bilateral low abdomen, "crampy", 8/10. Does not radiate. No aggravating or alleviating factors. Patient also reports decreased fetal movement today  Denies vaginal bleeding, leaking of fluid, fever, falls, or recent illness.    OB History    Gravida  3   Para  2   Term  2   Preterm  0   AB  0   Living  2     SAB  0   TAB  0   Ectopic  0   Multiple  0   Live Births  2           Past Medical History:  Diagnosis Date  . Headache   . Medical history non-contributory     Past Surgical History:  Procedure Laterality Date  . NO PAST SURGERIES      Family History  Problem Relation Age of Onset  . Cancer Maternal Grandfather        pancreatic  . Hypertension Maternal Grandmother     Social History   Tobacco Use  . Smoking status: Former Smoker    Types: Cigarettes  . Smokeless tobacco: Never Used  . Tobacco comment: quit May 2019  Substance Use Topics  . Alcohol use: No  . Drug use: No    Allergies: No Known Allergies  Facility-Administered Medications Prior to Admission  Medication Dose Route Frequency Provider Last Rate Last Dose  . cetirizine (ZYRTEC) tablet 10 mg  10 mg Oral Daily Degele, Kandra NicolasJulie P, MD       Medications Prior to Admission  Medication Sig Dispense Refill Last Dose  . acetaminophen (TYLENOL) 500 MG tablet Take 500 mg by mouth every 6 (six) hours as needed for mild pain or headache.   Taking  . cyclobenzaprine (FLEXERIL) 10 MG tablet Take 1 tablet (10 mg total) by mouth 2 (two) times daily as  needed for muscle spasms. (Patient not taking: Reported on 01/16/2018) 20 tablet 0 Not Taking  . ondansetron (ZOFRAN ODT) 4 MG disintegrating tablet Take 1 tablet (4 mg total) by mouth every 6 (six) hours as needed for nausea. (Patient not taking: Reported on 01/21/2018) 20 tablet 0 Not Taking  . Prenatal Multivit-Min-Fe-FA (PRENATAL VITAMINS) 0.8 MG tablet Take 1 tablet by mouth daily. (Patient not taking: Reported on 12/19/2017) 30 tablet 12 Not Taking    Review of Systems  Respiratory: Negative for shortness of breath.   Gastrointestinal: Positive for abdominal pain. Negative for nausea and vomiting.       Low abdominal contractions 8/10  Neurological: Negative for headaches.  All other systems reviewed and are negative.  Physical Exam   Pulse 99, temperature 98.7 F (37.1 C), temperature source Oral, resp. rate 19, height 5\' 2"  (1.575 m), weight 172 lb (78 kg), last menstrual period 05/12/2017, SpO2 100 %.  Physical Exam  Nursing note and vitals reviewed. Constitutional: She is oriented to person, place, and time. She appears well-developed and well-nourished.  Cardiovascular: Normal rate, regular rhythm, normal heart sounds and intact distal pulses.  GI:  Gravid  Genitourinary: Uterus normal.  Genitourinary Comments: 1/70/posterior  Neurological: She is alert and oriented to person, place, and time. She has normal reflexes.  Psychiatric: She has a normal mood and affect. Her behavior is normal. Judgment and thought content normal.    MAU Course  Procedures  MDM Reactive NST: baseline 150, positive accelerations, no decelerations Toco: contractions q 1-3 minutes, palpate mild to moderate Cervix unchanged from clinic approximately 90 minutes ago  Assessment and Plan  21 y.o. [id number] at [redacted]w[redacted]d  Reactive NST (Obix down, see paper strip) No cervical change 1/70/-2, declining recheck in one hour Reviewed general obstetric precautions including but not limited to falls, fever,  vaginal bleeding, leaking of fluid, decreased fetal movement, headache not relieved by Tylenol, rest and PO hydration.  Discharge home in stable condition  Next LOB appointment 02/04/2018  Calvert Cantor, CNM 01/29/2018, 10:43 AM

## 2018-01-29 NOTE — Patient Instructions (Signed)
Before Baby Comes Home  Before your baby arrives it is important to:   Have all of the supplies that you will need to care for your baby.   Know where to go if there is an emergency.   Discuss the baby's arrival with other family members.    What supplies will I need?    It is recommended that you have the following supplies:  Large Items   Crib.   Crib mattress.   Rear-facing infant car seat. If possible, have a trained professional check to make sure that it is installed correctly.    Feeding   6-8 bottles that are 4-5 oz in size.   6-8 nipples.   Bottle brush.   Sterilizer, or a large pan or kettle with a lid.   A way to boil and cool water.   If you will be breastfeeding:  ? Breast pump.  ? Nipple cream.  ? Nursing bra.  ? Breast pads.  ? Breast shields.   If you will be formula feeding:  ? Formula.  ? Measuring cups.  ? Measuring spoons.    Bathing   Mild baby soap and baby shampoo.   Petroleum jelly.   Soft cloth towel and washcloth.   Hooded towel.   Cotton balls.   Bath basin.    Other Supplies   Rectal thermometer.   Bulb syringe.   Baby wipes or washcloths for diaper changes.   Diaper bag.   Changing pad.   Clothing, including one-piece outfits and pajamas.   Baby nail clippers.   Receiving blankets.   Mattress pad and sheets for the crib.   Night-light for the baby's room.   Baby monitor.   2 or 3 pacifiers.   Either 24-36 cloth diapers and waterproof diaper covers or a box of disposable diapers. You may need to use as many as 10-12 diapers per day.    How do I prepare for an emergency?  Prepare for an emergency by:   Knowing how to get to the nearest hospital.   Listing the phone numbers of your baby's health care providers near your home phone and in your cell phone.    How do I prepare my family?   Decide how to handle visitors.   If you have other children:  ? Talk with them about the baby coming home. Ask them how they feel about it.  ? Read a book together about  being a new big brother or sister.  ? Find ways to let them help you prepare for the new baby.  ? Have someone ready to care for them while you are in the hospital.  This information is not intended to replace advice given to you by your health care provider. Make sure you discuss any questions you have with your health care provider.  Document Released: 06/08/2008 Document Revised: 12/02/2015 Document Reviewed: 06/03/2014  Elsevier Interactive Patient Education  2018 [employer]

## 2018-01-29 NOTE — Discharge Instructions (Signed)
Braxton Hicks Contractions °Contractions of the uterus can occur throughout pregnancy, but they are not always a sign that you are in labor. You may have practice contractions called Braxton Hicks contractions. These false labor contractions are sometimes confused with true labor. °What are Braxton Hicks contractions? °Braxton Hicks contractions are tightening movements that occur in the muscles of the uterus before labor. Unlike true labor contractions, these contractions do not result in opening (dilation) and thinning of the cervix. Toward the end of pregnancy (32-34 weeks), Braxton Hicks contractions can happen more often and may become stronger. These contractions are sometimes difficult to tell apart from true labor because they can be very uncomfortable. You should not feel embarrassed if you go to the hospital with false labor. °Sometimes, the only way to tell if you are in true labor is for your health care provider to look for changes in the cervix. The health care provider will do a physical exam and may monitor your contractions. If you are not in true labor, the exam should show that your cervix is not dilating and your water has not broken. °If there are other health problems associated with your pregnancy, it is completely safe for you to be sent home with false labor. You may continue to have Braxton Hicks contractions until you go into true labor. °How to tell the difference between true labor and false labor °True labor °· Contractions last 30-70 seconds. °· Contractions become very regular. °· Discomfort is usually felt in the top of the uterus, and it spreads to the lower abdomen and low back. °· Contractions do not go away with walking. °· Contractions usually become more intense and increase in frequency. °· The cervix dilates and gets thinner. °False labor °· Contractions are usually shorter and not as strong as true labor contractions. °· Contractions are usually irregular. °· Contractions  are often felt in the front of the lower abdomen and in the groin. °· Contractions may go away when you walk around or change positions while lying down. °· Contractions get weaker and are shorter-lasting as time goes on. °· The cervix usually does not dilate or become thin. °Follow these instructions at home: °· Take over-the-counter and prescription medicines only as told by your health care provider. °· Keep up with your usual exercises and follow other instructions from your health care provider. °· Eat and drink lightly if you think you are going into labor. °· If Braxton Hicks contractions are making you uncomfortable: °? Change your position from lying down or resting to walking, or change from walking to resting. °? Sit and rest in a tub of warm water. °? Drink enough fluid to keep your urine pale yellow. Dehydration may cause these contractions. °? Do slow and deep breathing several times an hour. °· Keep all follow-up prenatal visits as told by your health care provider. This is important. °Contact a health care provider if: °· You have a fever. °· You have continuous pain in your abdomen. °Get help right away if: °· Your contractions become stronger, more regular, and closer together. °· You have fluid leaking or gushing from your vagina. °· You pass blood-tinged mucus (bloody show). °· You have bleeding from your vagina. °· You have low back pain that you never had before. °· You feel your baby’s head pushing down and causing pelvic pressure. °· Your baby is not moving inside you as much as it used to. °Summary °· Contractions that occur before labor are called Braxton   Hicks contractions, false labor, or practice contractions. °· Braxton Hicks contractions are usually shorter, weaker, farther apart, and less regular than true labor contractions. True labor contractions usually become progressively stronger and regular and they become more frequent. °· Manage discomfort from Braxton Hicks contractions by  changing position, resting in a warm bath, drinking plenty of water, or practicing deep breathing. °This information is not intended to replace advice given to you by your health care provider. Make sure you discuss any questions you have with your health care provider. °Document Released: 11/09/2016 Document Revised: 11/09/2016 Document Reviewed: 11/09/2016 °Elsevier Interactive Patient Education © 2018 [employer] ° °

## 2018-01-29 NOTE — MAU Note (Signed)
Ongoing contractions.  Denies bleeding or leaking.  Reports urge to push when she uses the restroom.

## 2018-02-04 ENCOUNTER — Encounter: Payer: Medicaid Other | Admitting: Student

## 2018-05-11 ENCOUNTER — Encounter (HOSPITAL_COMMUNITY): Payer: Self-pay

## 2022-04-11 ENCOUNTER — Encounter: Payer: Self-pay | Admitting: Allergy

## 2022-04-11 ENCOUNTER — Ambulatory Visit (INDEPENDENT_AMBULATORY_CARE_PROVIDER_SITE_OTHER): Payer: Medicaid Other | Admitting: Allergy

## 2022-04-11 VITALS — BP 118/72 | HR 66 | Resp 16 | Ht 60.0 in | Wt 182.6 lb

## 2022-04-11 DIAGNOSIS — T783XXA Angioneurotic edema, initial encounter: Secondary | ICD-10-CM

## 2022-04-11 DIAGNOSIS — H109 Unspecified conjunctivitis: Secondary | ICD-10-CM

## 2022-04-11 DIAGNOSIS — H1013 Acute atopic conjunctivitis, bilateral: Secondary | ICD-10-CM | POA: Diagnosis not present

## 2022-04-11 DIAGNOSIS — T783XXD Angioneurotic edema, subsequent encounter: Secondary | ICD-10-CM

## 2022-04-11 DIAGNOSIS — L508 Other urticaria: Secondary | ICD-10-CM

## 2022-04-11 DIAGNOSIS — J310 Chronic rhinitis: Secondary | ICD-10-CM

## 2022-04-11 MED ORDER — LORATADINE 10 MG PO TABS: 10.0000 mg | ORAL_TABLET | Freq: Every day | ORAL | 5 refills | Status: DC

## 2022-04-11 MED ORDER — FAMOTIDINE 20 MG PO TABS: 20.0000 mg | ORAL_TABLET | Freq: Every day | ORAL | 5 refills | Status: DC

## 2022-04-11 NOTE — Progress Notes (Signed)
New Patient Note  RE: HARLEEN Thomas MRN: [id number] DOB: 08-Apr-1996 Date of Office Visit: 04/11/2022   Primary care provider: Garnetta Buddy I, NP  Chief Complaint: rash  History of present illness: Jill Thomas is a 26 y.o. female presenting today for evaluation of rash.    She states she started having rash after her second or third dose of depoprovera injection.  The first injection she states she has not issues with.  She states it comes up as welts.  It is itchy.  She states she can tell when it is about to start as the area gets hot or has a tingly feeling.  The rash can occur anywhere on the body and has been on the face.  The rash started about 3 months ago. The states the rash would be so bad that she would scratch a lot.  The rash occurs daily until she started taking Claritin daily for past month.  The claritin she states can stop the rash for couple days then it recurs.  She has used benadryl as well which made her sleepy. She reports lip swelling, around eye swelling and ankle swelling with the rash.  She does report that her feet and hands can ache/hurt with the rash.  No fevers.  Denies preceding illness. No concern for bites/stings.  No new medications.  No change in her diet and has not noted any foods that flare the rash.  She did stop using scented soaps but otherwise did not change any body products.  The hives can last about 2-3 minutes before going away. It does leave some redness behind but that does go away.   No history of eczema, asthma, food allergy.  She does report some pollen allergy symptoms and will use claritin that resolves symptoms.  She reports itchy throat and water eyes primarily.      Review of systems: Review of Systems  Constitutional: Negative.   HENT: Negative.    Eyes: Negative.   Respiratory: Negative.    Cardiovascular: Negative.   Gastrointestinal: Negative.   Musculoskeletal: Negative.   Skin:  Positive for rash.   Allergic/Immunologic: Negative.   Neurological: Negative.     All other systems negative unless noted above in HPI  Past medical history: Past Medical History:  Diagnosis Date   Headache    Urticaria     Past surgical history: Past Surgical History:  Procedure Laterality Date   NO PAST SURGERIES      Family history:  Family History  Problem Relation Age of Onset   Cancer Maternal Grandfather        pancreatic   Hypertension Maternal Grandmother     Social history: Lives in a an apartment without carpeting with gas heating and window cooling.  Dog in the home.  Cats and dogs outside  the home.  No concern for water damage, mildew or roaches in the home.  She is a Human resources officer.  She has a smoking history since 26yo of [place] 1 pack/day.    Medication List: Current Outpatient Medications  Medication Sig Dispense Refill   famotidine (PEPCID) 20 MG tablet Take 1 tablet (20 mg total) by mouth daily. Increase to twice a day if needed 60 tablet 5   loratadine (CLARITIN) 10 MG tablet Take 1 tablet (10 mg total) by mouth daily. Increase to twice daily if needed 60 tablet 5   Current Facility-Administered Medications  Medication Dose Route Frequency Provider Last Rate Last Admin  cetirizine (ZYRTEC) tablet 10 mg  10 mg Oral Daily Degele, Kandra Nicolas, MD        Known medication allergies: No Known Allergies   Physical examination: Blood pressure 118/72, pulse 66, resp. rate 16, height 5' (1.524 m), weight 182 lb 9.6 oz (82.8 kg), SpO2 98 %, unknown if currently breastfeeding.  General: Alert, interactive, in no acute distress. HEENT: PERRLA, TMs pearly gray, turbinates non-edematous without discharge, post-pharynx non erythematous. Neck: Supple without lymphadenopathy. Lungs: Clear to auscultation without wheezing, rhonchi or rales. {no increased work of breathing. CV: Normal S1, S2 without murmurs. Abdomen: Nondistended, nontender. Skin: Scattered erythematous  urticarial type lesions primarily located back around bra , nonvesicular. Extremities:  No clubbing, cyanosis or edema. Neuro:   Grossly intact.  Diagnositics/Labs: None today  Assessment and plan:   Chronic urticaria and angioedema  - at this time etiology of hives and swelling is unknown.  Hives can be caused by a variety of different triggers including illness/infection, foods, medications, stings, exercise, pressure, vibrations, extremes of temperature to name a few however majority of the time there is no identifiable trigger.  Your symptoms have been ongoing for >6 weeks making this chronic thus will obtain labwork to evaluate: CBC w diff, CMP, tryptase, hive panel, environmental panel, alpha-gal panel, inflammatory markers.  - for hive control recommend the following antihistamine regimen: Claritin 10mg  1 tab daily with Pepcid 20mg  1 tab daily.    If daily dosing of these medications is not enough to control hives then increase both to twice a day (in AM and in PM).    - if twice a day dosing is not effective enough let me know and would add in Singulair daily to this regimen.  If triple therapy regimen is not effective enough in controlling hives/swelling then would initiate Xolair monthly injections. 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  Will discuss this more if needed in future.  Informational handout provided.   - reserved benadryl for breakthrough symptoms if needed    Follow-up in 2-3 months or sooner if needed I appreciate the opportunity to take part in Tristyn's care. Please do not hesitate to contact me with questions.  Sincerely,   , MD Allergy/Immunology [facility]

## 2022-04-11 NOTE — Patient Instructions (Addendum)
Chronic hives and swelling  - at this time etiology of hives and swelling is unknown.  Hives can be caused by a variety of different triggers including illness/infection, foods, medications, stings, exercise, pressure, vibrations, extremes of temperature to name a few however majority of the time there is no identifiable trigger.  Your symptoms have been ongoing for >6 weeks making this chronic thus will obtain labwork to evaluate: CBC w diff, CMP, tryptase, hive panel, environmental panel, alpha-gal panel, inflammatory markers.  - for hive control recommend the following antihistamine regimen: Claritin 10mg  1 tab daily with Pepcid 20mg  1 tab daily.    If daily dosing of these medications is not enough to control hives then increase both to twice a day (in AM and in PM).    - if twice a day dosing is not effective enough let me know and would add in Singulair daily to this regimen.  If triple therapy regimen is not effective enough in controlling hives/swelling then would initiate Xolair monthly injections. 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  Will discuss this more if needed in future.  Informational handout provided.   - reserved benadryl for breakthrough symptoms if needed  Follow-up in 2-3 months or sooner if needed  **We are ordering labs, so please allow 1-2 weeks for the results to come back.  With the newly implemented Cures Act, the labs might be visible to you at the same time that they become visible to me.  However, I will not address the results until all of the results come  back, so please be patient.  In the meantime, continue avoiding your triggering food(s) in your After Visit Summary, including avoidance measures (if applicable), until you hear from me about the results.

## 2022-04-25 ENCOUNTER — Encounter: Payer: Self-pay | Admitting: Allergy

## 2022-05-02 ENCOUNTER — Telehealth: Payer: Self-pay | Admitting: Allergy

## 2022-05-02 NOTE — Telephone Encounter (Signed)
Patient is requesting a call back directly from Dr. Nelva Bush. She has additional questions regarding her lab results. I did ask patient what her questions were so that I could go ahead and send them to Dr. Nelva Bush but she refused to tell me.

## 2022-05-02 NOTE — Telephone Encounter (Signed)
Please advice . Thank you

## 2022-05-03 LAB — ALLERGENS W/TOTAL IGE AREA 2
Alternaria Alternata IgE: <0.1 kU/L
Aspergillus Fumigatus IgE: <0.1 kU/L
Bermuda Grass IgE: <0.1 kU/L
Cat Dander IgE: <0.1 kU/L
Cedar, Mountain IgE: <0.1 kU/L
Cladosporium Herbarum IgE: <0.1 kU/L
Cockroach, German IgE: <0.1 kU/L
Common Silver Birch IgE: <0.1 kU/L
Cottonwood IgE: <0.1 kU/L
D Farinae IgE: <0.1 kU/L
D Pteronyssinus IgE: <0.1 kU/L
Dog Dander IgE: <0.1 kU/L
Elm, American IgE: <0.1 kU/L
Johnson Grass IgE: <0.1 kU/L
Maple/Box Elder IgE: <0.1 kU/L
Mouse Urine IgE: <0.1 kU/L
Oak, White IgE: <0.1 kU/L
Pecan, Hickory IgE: <0.1 kU/L
Penicillium Chrysogen IgE: <0.1 kU/L
Pigweed, Rough IgE: <0.1 kU/L
Ragweed, Short IgE: <0.1 kU/L
Sheep Sorrel IgE Qn: <0.1 kU/L
Timothy Grass IgE: 0.11 kU/L — AB
White Mulberry IgE: <0.1 kU/L

## 2022-05-03 LAB — CBC WITH DIFFERENTIAL
Basophils Absolute: 0 10*3/uL (ref 0.0–0.2)
Basos: 1 %
EOS (ABSOLUTE): 0.1 10*3/uL (ref 0.0–0.4)
Eos: 1 %
Hematocrit: 40.1 % (ref 34.0–46.6)
Hemoglobin: 13 g/dL (ref 11.1–15.9)
Immature Grans (Abs): 0 10*3/uL (ref 0.0–0.1)
Immature Granulocytes: 0 %
Lymphocytes Absolute: 3 10*3/uL (ref 0.7–3.1)
Lymphs: 35 %
MCH: 27.7 pg (ref 26.6–33.0)
MCHC: 32.4 g/dL (ref 31.5–35.7)
MCV: 85 fL (ref 79–97)
Monocytes Absolute: 0.5 10*3/uL (ref 0.1–0.9)
Monocytes: 6 %
Neutrophils Absolute: 5.1 10*3/uL (ref 1.4–7.0)
Neutrophils: 57 %
RBC: 4.7 x10E6/uL (ref 3.77–5.28)
RDW: 12.9 % (ref 11.7–15.4)
WBC: 8.7 10*3/uL (ref 3.4–10.8)

## 2022-05-03 LAB — ALPHA-GAL PANEL
Allergen Lamb IgE: <0.1 kU/L
Beef IgE: <0.1 kU/L
IgE (Immunoglobulin E), Serum: 38 IU/mL (ref 6–495)
O215-IgE Alpha-Gal: <0.1 kU/L
Pork IgE: <0.1 kU/L

## 2022-05-03 LAB — COMPREHENSIVE METABOLIC PANEL
ALT: 11 IU/L (ref 0–32)
AST: 12 IU/L (ref 0–40)
Albumin/Globulin Ratio: 1.4 (ref 1.2–2.2)
Albumin: 4.3 g/dL (ref 4.0–5.0)
Alkaline Phosphatase: 128 IU/L — ABNORMAL HIGH (ref 44–121)
BUN/Creatinine Ratio: 13 (ref 9–23)
BUN: 8 mg/dL (ref 6–20)
Bilirubin Total: <0.2 mg/dL (ref 0.0–1.2)
CO2: 22 mmol/L (ref 20–29)
Calcium: 9.6 mg/dL (ref 8.7–10.2)
Chloride: 103 mmol/L (ref 96–106)
Creatinine, Ser: 0.6 mg/dL (ref 0.57–1.00)
Globulin, Total: 3 g/dL (ref 1.5–4.5)
Glucose: 116 mg/dL — ABNORMAL HIGH (ref 70–99)
Potassium: 3.8 mmol/L (ref 3.5–5.2)
Sodium: 140 mmol/L (ref 134–144)
Total Protein: 7.3 g/dL (ref 6.0–8.5)
eGFR: 127 mL/min/{1.73_m2} (ref >59)

## 2022-05-03 LAB — ANA W/REFLEX: Anti Nuclear Antibody (ANA): NEGATIVE

## 2022-05-03 LAB — CHRONIC URTICARIA: cu index: <4.6 (ref <10)

## 2022-05-03 LAB — TRYPTASE: Tryptase: 8.7 ug/L (ref 2.2–13.2)

## 2022-05-03 LAB — SEDIMENTATION RATE: Sed Rate: 22 mm/hr (ref 0–32)

## 2022-05-03 LAB — THYROID ANTIBODIES
Thyroglobulin Antibody: <1 IU/mL (ref 0.0–0.9)
Thyroperoxidase Ab SerPl-aCnc: 18 IU/mL (ref 0–34)

## 2022-05-03 NOTE — Telephone Encounter (Signed)
Please see MyChart message from patient 

## 2022-05-12 ENCOUNTER — Telehealth: Payer: Self-pay | Admitting: Allergy

## 2022-05-12 ENCOUNTER — Other Ambulatory Visit: Payer: Self-pay | Admitting: *Deleted

## 2022-05-12 MED ORDER — LORATADINE 10 MG PO TABS: 10.0000 mg | ORAL_TABLET | Freq: Every day | ORAL | 5 refills | Status: DC

## 2022-05-12 MED ORDER — FAMOTIDINE 20 MG PO TABS: 20.0000 mg | ORAL_TABLET | Freq: Every day | ORAL | 5 refills | Status: DC

## 2022-05-12 NOTE — Telephone Encounter (Signed)
Resent Claritin and Pepcid to the requested pharmacy. Called and informed the patient, patient verbalized understanding.

## 2022-05-12 NOTE — Telephone Encounter (Signed)
Jill Thomas called in and states she never received any of her prescriptions and that [facility] states they never received the request.  She would like Pepcid [facility]yrtec to [facility] on [address]

## 2022-10-31 ENCOUNTER — Other Ambulatory Visit: Payer: Self-pay

## 2022-10-31 MED ORDER — FAMOTIDINE 20 MG PO TABS: 20.0000 mg | ORAL_TABLET | Freq: Every day | ORAL | 5 refills | Status: DC

## 2022-10-31 MED ORDER — LORATADINE 10 MG PO TABS: 10.0000 mg | ORAL_TABLET | Freq: Every day | ORAL | 5 refills | Status: DC

## 2022-11-07 ENCOUNTER — Ambulatory Visit: Payer: Medicaid Other | Admitting: Allergy

## 2023-01-02 ENCOUNTER — Other Ambulatory Visit: Payer: Self-pay

## 2023-01-02 MED ORDER — LORATADINE 10 MG PO TABS: 10.0000 mg | ORAL_TABLET | Freq: Every day | ORAL | 5 refills | Status: AC

## 2023-01-02 MED ORDER — FAMOTIDINE 20 MG PO TABS: 20.0000 mg | ORAL_TABLET | Freq: Every day | ORAL | 5 refills | Status: DC

## 2023-01-09 ENCOUNTER — Ambulatory Visit: Payer: Medicaid Other | Admitting: Allergy

## 2023-02-15 ENCOUNTER — Other Ambulatory Visit: Payer: Self-pay | Admitting: *Deleted

## 2023-02-15 MED ORDER — CETIRIZINE HCL 10 MG PO TABS: ORAL_TABLET | ORAL | 2 refills | Status: AC

## 2023-11-10 ENCOUNTER — Emergency Department (HOSPITAL_COMMUNITY)
Admission: EM | Admit: 2023-11-10 | Discharge: 2023-11-10 | Disposition: A | Discharge status: Home / Self Care | Attending: Student | Admitting: Student

## 2023-11-10 ENCOUNTER — Encounter (HOSPITAL_COMMUNITY): Payer: Self-pay | Admitting: *Deleted

## 2023-11-10 ENCOUNTER — Other Ambulatory Visit: Payer: Self-pay

## 2023-11-10 ENCOUNTER — Emergency Department (HOSPITAL_COMMUNITY)

## 2023-11-10 DIAGNOSIS — F1210 Cannabis abuse, uncomplicated: Secondary | ICD-10-CM | POA: Diagnosis not present

## 2023-11-10 DIAGNOSIS — R079 Chest pain, unspecified: Secondary | ICD-10-CM | POA: Insufficient documentation

## 2023-11-10 DIAGNOSIS — R002 Palpitations: Secondary | ICD-10-CM | POA: Diagnosis present

## 2023-11-10 DIAGNOSIS — F419 Anxiety disorder, unspecified: Secondary | ICD-10-CM | POA: Insufficient documentation

## 2023-11-10 DIAGNOSIS — Z87891 Personal history of nicotine dependence: Secondary | ICD-10-CM | POA: Diagnosis not present

## 2023-11-10 LAB — CBC
HCT: 42 % (ref 36.0–46.0)
Hemoglobin: 13.6 g/dL (ref 12.0–15.0)
MCH: 29.1 pg (ref 26.0–34.0)
MCHC: 32.4 g/dL (ref 30.0–36.0)
MCV: 89.7 fL (ref 80.0–100.0)
Platelets: 401 10*3/uL — ABNORMAL HIGH (ref 150–400)
RBC: 4.68 MIL/uL (ref 3.87–5.11)
RDW: 13.7 % (ref 11.5–15.5)
WBC: 13.2 10*3/uL — ABNORMAL HIGH (ref 4.0–10.5)
nRBC: 0 % (ref 0.0–0.2)

## 2023-11-10 LAB — BASIC METABOLIC PANEL WITH GFR
Anion gap: 11 (ref 5–15)
BUN: 12 mg/dL (ref 6–20)
CO2: 22 mmol/L (ref 22–32)
Calcium: 9.1 mg/dL (ref 8.9–10.3)
Chloride: 104 mmol/L (ref 98–111)
Creatinine, Ser: 0.87 mg/dL (ref 0.44–1.00)
GFR, Estimated: >60 mL/min (ref >60)
Glucose, Bld: 182 mg/dL — ABNORMAL HIGH (ref 70–99)
Potassium: 3.3 mmol/L — ABNORMAL LOW (ref 3.5–5.1)
Sodium: 137 mmol/L (ref 135–145)

## 2023-11-10 LAB — TROPONIN I (HIGH SENSITIVITY): Troponin I (High Sensitivity): 3 ng/L (ref <18)

## 2023-11-10 LAB — HCG, SERUM, QUALITATIVE: Preg, Serum: NEGATIVE

## 2023-11-10 MED ORDER — LORAZEPAM 1 MG PO TABS
0.5000 mg | ORAL_TABLET | Freq: Once | ORAL | Status: AC
  Administered 2023-11-10: 0.5 mg via ORAL
  Filled 2023-11-10: qty 1

## 2023-11-10 MED ORDER — POTASSIUM CHLORIDE CRYS ER 20 MEQ PO TBCR
40.0000 meq | EXTENDED_RELEASE_TABLET | Freq: Once | ORAL | Status: AC
  Administered 2023-11-10: 40 meq via ORAL
  Filled 2023-11-10: qty 2

## 2023-11-10 MED ORDER — MAGNESIUM OXIDE -MG SUPPLEMENT 400 (240 MG) MG PO TABS
800.0000 mg | ORAL_TABLET | Freq: Once | ORAL | Status: AC
  Administered 2023-11-10: 800 mg via ORAL
  Filled 2023-11-10: qty 2

## 2023-11-10 NOTE — ED Provider Triage Note (Addendum)
 Emergency Medicine Provider Triage Evaluation Note  Jill Thomas , a 28 y.o. female  was evaluated in triage.  Pt complains of chest tightness and shortness of breath beginning 1 hour prior to arrival.  Reports she recently began estrogen pills.  States she is been taking them for 1 week.  Endorsing chest tightness, shortness of breath.  Denies radiation of pain.  Endorsing "a little bit" of lightheadedness on standing.  Denies leg swelling.  Denies nausea, vomiting or abdominal pain.  She is currently in her menstrual cycle.  Patient states she feels as if she cannot breathe in triage.  100% oxygen saturation noted.  Patient placed on oxygen for comfort.  Patient is tachycardic in triage with pulse rate of 134.  Will obtain EKG, chest pain labs, scan for PE.  Review of Systems  Positive:  Negative:   Physical Exam  BP (!) 146/90   Pulse (!) 134   Temp 98.4 F (36.9 C) (Oral)   Resp 18   SpO2 99%  Gen:   Awake, no distress   Resp:  Normal effort  MSK:   Moves extremities without difficulty  Other:    Medical Decision Making  Medically screening exam initiated at 5:16 PM.  Appropriate orders placed.  Meredith L Loftin was informed that the remainder of the evaluation will be completed by another provider, this initial triage assessment does not replace that evaluation, and the importance of remaining in the ED until their evaluation is complete.      Adel Aden, PA-C 11/10/23 1717

## 2023-11-10 NOTE — ED Provider Notes (Signed)
 [place] EMERGENCY DEPARTMENT AT [facility] Provider Note  CSN: [id number] Arrival date & time: 11/10/23 1654  Chief Complaint(s) Chest Pain  HPI Jill Thomas is a 28 y.o. female who presents emergency room for evaluation of chest pain, palpitations.  States that she smoked marijuana earlier today and started to have a sensation of paranoia, anxiety and rapid heart rate.  Denies associated abdominal pain, nausea, vomiting, headache, fever or other systemic symptoms.   Past Medical History Past Medical History:  Diagnosis Date   Headache    Urticaria    Patient Active Problem List   Diagnosis Date Noted   Echogenic intracardiac focus of fetus on prenatal ultrasound 10/23/2017   Supervision of other normal pregnancy, antepartum 08/06/2017   Home Medication(s) Prior to Admission medications   Medication Sig Start Date End Date Taking? Authorizing Provider  cetirizine  (ZYRTEC ) 10 MG tablet Take one tablet once daily 02/15/23   Brian Campanile, MD  famotidine  (PEPCID ) 20 MG tablet Take 1 tablet (20 mg total) by mouth daily. Increase to twice a day if needed 01/02/23   Brian Campanile, MD  loratadine  (CLARITIN ) 10 MG tablet Take 1 tablet (10 mg total) by mouth daily. Increase to twice daily if needed 01/02/23   Brian Campanile, MD                                                                                                                                    Past Surgical History Past Surgical History:  Procedure Laterality Date   NO PAST SURGERIES     Family History Family History  Problem Relation Age of Onset   Cancer Maternal Grandfather        pancreatic   Hypertension Maternal Grandmother     Social History Social History   Tobacco Use   Smoking status: Former    Types: Cigarettes   Smokeless tobacco: Never   Tobacco comments:    quit May 2019  Substance Use Topics   Alcohol use: No   Drug use: No    Allergies Patient has no known allergies.  Review of Systems Review of Systems  Cardiovascular:  Positive for chest pain and palpitations.  Psychiatric/Behavioral:  The patient is nervous/anxious.     Physical Exam Vital Signs  I have reviewed the triage vital signs BP 129/84 (BP Location: Right Arm)   Pulse 76   Temp 98.3 F (36.8 C) (Oral)   Resp 18   Ht 5' (1.524 m)   Wt 82.8 kg   LMP 11/10/2023   SpO2 99%   BMI 35.65 kg/m   Physical Exam Vitals and nursing note reviewed.  Constitutional:      General: She is not in acute distress.    Appearance: She is well-developed.  HENT:     Head: Normocephalic and atraumatic.  Eyes:     Conjunctiva/sclera: Conjunctivae normal.  Cardiovascular:  Rate and Rhythm: Normal rate and regular rhythm.     Heart sounds: No murmur heard. Pulmonary:     Effort: Pulmonary effort is normal. No respiratory distress.     Breath sounds: Normal breath sounds.  Abdominal:     Palpations: Abdomen is soft.     Tenderness: There is no abdominal tenderness.  Musculoskeletal:        General: No swelling.     Cervical back: Neck supple.  Skin:    General: Skin is warm and dry.     Capillary Refill: Capillary refill takes less than 2 seconds.  Neurological:     Mental Status: She is alert.  Psychiatric:        Mood and Affect: Mood normal.     ED Results and Treatments Labs (all labs ordered are listed, but only abnormal results are displayed) Labs Reviewed  BASIC METABOLIC PANEL WITH GFR - Abnormal; Notable for the following components:      Result Value   Potassium 3.3 (*)    Glucose, Bld 182 (*)    All other components within normal limits  CBC - Abnormal; Notable for the following components:   WBC 13.2 (*)    Platelets 401 (*)    All other components within normal limits  HCG, SERUM, QUALITATIVE  TROPONIN I (HIGH SENSITIVITY)                                                                                                                           Radiology DG Chest Port 1 View Result Date: 11/10/2023 CLINICAL DATA:  Chest pain and shortness of breath. EXAM: PORTABLE CHEST 1 VIEW COMPARISON:  Chest radiograph dated 04/18/2013. FINDINGS: The heart size and mediastinal contours are within normal limits. Both lungs are clear. The visualized skeletal structures are unremarkable. IMPRESSION: No active disease. Electronically Signed   By: Angus Bark M.D.   On: 11/10/2023 17:59    Pertinent labs & imaging results that were available during my care of the patient were reviewed by me and considered in my medical decision making (see MDM for details).  Medications Ordered in ED Medications  potassium chloride SA (KLOR-CON M) CR tablet 40 mEq (40 mEq Oral Given 11/10/23 1929)  magnesium oxide (MAG-OX) tablet 800 mg (800 mg Oral Given 11/10/23 1929)  LORazepam (ATIVAN) tablet 0.5 mg (0.5 mg Oral Given 11/10/23 1929)  Procedures Procedures  (including critical care time)  Medical Decision Making / ED Course   This patient presents to the ED for concern of palpitations, chest pain, this involves an extensive number of treatment options, and is a complaint that carries with it a high risk of complications and morbidity.  The differential diagnosis includes THC use, dehydration, ACS, PE, pneumonia, dehydration  MDM: Patient seen emerged part for evaluation of palpitations and anxiety with chest pain after smoking marijuana.  Physical exam is largely unremarkable.  Her tachycardia that she initially presented with has self resolved.  Laboratory evaluation with some mild hypokalemia to 3.3 which was repleted in the emergency department.  Mild leukocytosis of 13.2.  Pregnancy negative.  High-sensitivity troponin is negative.  ECG without evidence of ischemia.  Shows sinus tachycardia with long QT.   Magnesium administered and I did discuss this with the patient.  Chest x-ray unremarkable.  Patient given small dose of Ativan and symptoms have significant proved.  I did counsel cessation regarding THC use and patient states she will stop smoking marijuana.  At this time she does not meet inpatient criteria for admission and will be discharged with outpatient follow-up.  Strict return precautions given which she voiced understanding.   Additional history obtained:  -External records from outside source obtained and reviewed including: Chart review including previous notes, labs, imaging, consultation notes   Lab Tests: -I ordered, reviewed, and interpreted labs.   The pertinent results include:   Labs Reviewed  BASIC METABOLIC PANEL WITH GFR - Abnormal; Notable for the following components:      Result Value   Potassium 3.3 (*)    Glucose, Bld 182 (*)    All other components within normal limits  CBC - Abnormal; Notable for the following components:   WBC 13.2 (*)    Platelets 401 (*)    All other components within normal limits  HCG, SERUM, QUALITATIVE  TROPONIN I (HIGH SENSITIVITY)      EKG   EKG Interpretation Date/Time:  Saturday Nov 10 2023 17:20:20 EDT Ventricular Rate:  131 PR Interval:    QRS Duration:  74 QT Interval:  394 QTC Calculation: 581 R Axis:   77  Text Interpretation: Normal sinus rhythm Long QT Otherwise normal ECG No previous ECGs available Confirmed by Daryana Whirley ([id number]) on 11/10/2023 8:40:57 PM         Imaging Studies ordered: I ordered imaging studies including chest x-ray I independently visualized and interpreted imaging. I agree with the radiologist interpretation   Medicines ordered and prescription drug management: Meds ordered this encounter  Medications   potassium chloride SA (KLOR-CON M) CR tablet 40 mEq   magnesium oxide (MAG-OX) tablet 800 mg   LORazepam (ATIVAN) tablet 0.5 mg    -I have reviewed the patients home medicines  and have made adjustments as needed  Critical interventions none   Cardiac Monitoring: The patient was maintained on a cardiac monitor.  I personally viewed and interpreted the cardiac monitored which showed an underlying rhythm of: NSR  Social Determinants of Health:  Factors impacting patients care include: Marijuana use, counseled cessation   Reevaluation: After the interventions noted above, I reevaluated the patient and found that they have :improved  Co morbidities that complicate the patient evaluation  Past Medical History:  Diagnosis Date   Headache    Urticaria       Dispostion: I considered admission for this patient, but at this time she does not meet inpatient criteria for  admission and will be discharged with outpatient follow-up     Final Clinical Impression(s) / ED Diagnoses Final diagnoses:  None     @PCDICTATION @    Karlyn Overman, MD 11/10/23 2042

## 2023-11-10 NOTE — ED Notes (Signed)
 ED Provider at bedside.

## 2023-11-10 NOTE — ED Triage Notes (Signed)
 The  pt has had chest pain and a rapid heart rate since this am no previous history  she has a hx of anxiety but not recently lmp now

## 2023-11-10 NOTE — ED Notes (Signed)
 Xray contacted to do the port chest xray  they are coming

## 2024-02-27 ENCOUNTER — Other Ambulatory Visit: Payer: Self-pay

## 2024-02-27 ENCOUNTER — Emergency Department (HOSPITAL_COMMUNITY)
Admission: EM | Admit: 2024-02-27 | Discharge: 2024-02-27 | Disposition: A | Discharge status: Home / Self Care | Source: Ambulatory Visit | Attending: Emergency Medicine | Admitting: Emergency Medicine

## 2024-02-27 ENCOUNTER — Emergency Department (HOSPITAL_COMMUNITY)

## 2024-02-27 ENCOUNTER — Encounter (HOSPITAL_COMMUNITY): Payer: Self-pay

## 2024-02-27 ENCOUNTER — Ambulatory Visit
Admission: EM | Admit: 2024-02-27 | Discharge: 2024-02-27 | Disposition: A | Discharge status: Home / Self Care | Attending: Physician Assistant | Admitting: Physician Assistant

## 2024-02-27 DIAGNOSIS — R058 Other specified cough: Secondary | ICD-10-CM

## 2024-02-27 DIAGNOSIS — R059 Cough, unspecified: Secondary | ICD-10-CM | POA: Insufficient documentation

## 2024-02-27 DIAGNOSIS — R0789 Other chest pain: Secondary | ICD-10-CM | POA: Diagnosis present

## 2024-02-27 DIAGNOSIS — R0981 Nasal congestion: Secondary | ICD-10-CM | POA: Diagnosis not present

## 2024-02-27 DIAGNOSIS — R0600 Dyspnea, unspecified: Secondary | ICD-10-CM | POA: Diagnosis not present

## 2024-02-27 DIAGNOSIS — R079 Chest pain, unspecified: Secondary | ICD-10-CM

## 2024-02-27 DIAGNOSIS — R002 Palpitations: Secondary | ICD-10-CM | POA: Diagnosis not present

## 2024-02-27 DIAGNOSIS — R0602 Shortness of breath: Secondary | ICD-10-CM

## 2024-02-27 LAB — RESP PANEL BY RT-PCR (RSV, FLU A&B, COVID)  RVPGX2
Influenza A by PCR: NEGATIVE
Influenza B by PCR: NEGATIVE
Resp Syncytial Virus by PCR: NEGATIVE
SARS Coronavirus 2 by RT PCR: NEGATIVE

## 2024-02-27 LAB — BASIC METABOLIC PANEL WITH GFR
Anion gap: 9 (ref 5–15)
BUN: 8 mg/dL (ref 6–20)
CO2: 24 mmol/L (ref 22–32)
Calcium: 9.4 mg/dL (ref 8.9–10.3)
Chloride: 104 mmol/L (ref 98–111)
Creatinine, Ser: 0.63 mg/dL (ref 0.44–1.00)
GFR, Estimated: >60 mL/min (ref >60)
Glucose, Bld: 94 mg/dL (ref 70–99)
Potassium: 3.6 mmol/L (ref 3.5–5.1)
Sodium: 137 mmol/L (ref 135–145)

## 2024-02-27 LAB — CBC
HCT: 41.5 % (ref 36.0–46.0)
Hemoglobin: 13.5 g/dL (ref 12.0–15.0)
MCH: 29.6 pg (ref 26.0–34.0)
MCHC: 32.5 g/dL (ref 30.0–36.0)
MCV: 91 fL (ref 80.0–100.0)
Platelets: 343 K/uL (ref 150–400)
RBC: 4.56 MIL/uL (ref 3.87–5.11)
RDW: 13.4 % (ref 11.5–15.5)
WBC: 8.4 K/uL (ref 4.0–10.5)
nRBC: 0 % (ref 0.0–0.2)

## 2024-02-27 LAB — HCG, SERUM, QUALITATIVE: Preg, Serum: NEGATIVE

## 2024-02-27 LAB — TROPONIN I (HIGH SENSITIVITY): Troponin I (High Sensitivity): <2 ng/L (ref <18)

## 2024-02-27 MED ORDER — DM-GUAIFENESIN ER 30-600 MG PO TB12
1.0000 | ORAL_TABLET | Freq: Two times a day (BID) | ORAL | Status: DC
  Administered 2024-02-27: 1 via ORAL
  Filled 2024-02-27: qty 1

## 2024-02-27 MED ORDER — DM-GUAIFENESIN ER 30-600 MG PO TB12: 1.0000 | ORAL_TABLET | Freq: Two times a day (BID) | ORAL | 0 refills | Status: DC

## 2024-02-27 NOTE — Discharge Instructions (Addendum)
 Thank you for letting us  evaluate you today.  Your cardiac workup was unremarkable.  I have low suspicion for acute coronary syndrome/heart attack.  Your chest x-ray did not show any fluid nor pneumonia.  Your other lab work was unremarkable.  Your potassium was normal.  We did provide you Mucinex  DM in Emergency Department for mucous.  I have also provided you with a prescription for this as well.  You may also try humidifier, over-the-counter nasal decongestion (do not use longer than 3 days), cough drops, plenty of fluids for suspected viral infection.  You may use Tylenol  and ibuprofen  intermittently every 8 hours as needed for body aches, fever of greater than 100.4 F noted on thermometer  I have sent COVID, flu, RSV test and we will call you if results are positive.  You may also see these results on MyChart  Return to Emergency Department if you experience worsening chest pain, shortness of breath, symptoms

## 2024-02-27 NOTE — ED Triage Notes (Signed)
 Pt presents to uc with co facial pressure, chest congestion, cough, sob, diaphoresis, heart palpitations, mucous in throat for 2 days. Pt is concerned as her client has pneumonia. Pt reports history of irregular electrolytes. Pt reports she has been taking otc potassium pills, vitamins.

## 2024-02-27 NOTE — ED Notes (Signed)
 Patient is being discharged from the Urgent Care and sent to the Emergency Department via pov with daughter . Per [employer] PA, patient is in need of higher level of care due to palpitations, sob, and hx of electrolyte imbalance. Patient is aware and verbalizes understanding of plan of care.  Vitals:   02/27/24 1009  BP: 124/81  Pulse: 80  Resp: 19  Temp: 98.5 F (36.9 C)  SpO2: 98%

## 2024-02-27 NOTE — ED Triage Notes (Signed)
 Chest pressure on right side that radiates into back. Cough, congestion. Exposed to pneumonia at work.

## 2024-02-27 NOTE — ED Provider Notes (Signed)
 Patient presented to urgent care with multiple complaints including shortness of breath, diaphoresis and heart palpitations.  She reports symptoms started 2 days ago and she has history of irregular electrolytes.  I recommended further evaluation in the emergency room for stat labs and possible imaging.  Patient is agreeable with same and will transport via POV.  She is stable in office and safe to transport.   Billy Asberry FALCON, PA-C 02/27/24 1017

## 2024-02-27 NOTE — ED Provider Notes (Signed)
 [place] EMERGENCY DEPARTMENT AT [facility] Provider Note   CSN: [id number] Arrival date & time: 02/27/24  1039     Patient presents with: Chest Pain   Jill Thomas is a 28 y.o. female with past medical history of THC use presents to emergency department for evaluation of chest pressure on right side that radiates into her back with associated nonproductive cough, nasal congestion, mucus in throat.  Reports that she also has palpitations intermittently 3-5 times a day.  No shortness of breath with palpitations.  No chest pain or pressure with exertion.  Does occasionally feel short of breath when lying down but contributes this to current nasal congestion as she has never had this before.  Does take hydroxyzine for anxiety up to 3 times a day as needed.  Also feels like she has mucus in her throat that she has to cough up.  Was exposed to pneumonia and coworkers coughing at work.  Denies fevers.  Was last seen in ED on 11/10/2023 for palpitations and THC use with reassuring cardiac workup.  Typically uses THC once a week with last using 1 week ago.    Chest Pain     Prior to Admission medications   Medication Sig Start Date End Date Taking? Authorizing Provider  dextromethorphan -guaiFENesin  (MUCINEX  DM) 30-600 MG 12hr tablet Take 1 tablet by mouth 2 (two) times daily for 7 days. 02/27/24 03/05/24 Yes Minnie Tinnie BRAVO, PA  cetirizine  (ZYRTEC ) 10 MG tablet Take one tablet once daily 02/15/23   Jeneal Danita Macintosh, MD  famotidine  (PEPCID ) 20 MG tablet Take 1 tablet (20 mg total) by mouth daily. Increase to twice a day if needed 01/02/23   Jeneal Danita Macintosh, MD  loratadine  (CLARITIN ) 10 MG tablet Take 1 tablet (10 mg total) by mouth daily. Increase to twice daily if needed 01/02/23   Jeneal Danita Macintosh, MD    Allergies: Patient has no known allergies.    Review of Systems  Cardiovascular:  Positive for chest pain.    Updated Vital Signs BP (!) 133/95    Pulse 88   Temp 98.3 F (36.8 C) (Oral)   Resp 16   Ht 5' (1.524 m)   Wt 83.9 kg   LMP 02/23/2024   SpO2 100%   BMI 36.13 kg/m   Physical Exam Vitals and nursing note reviewed.  Constitutional:      General: She is not in acute distress.    Appearance: Normal appearance. She is not ill-appearing.  HENT:     Head: Normocephalic and atraumatic.     Right Ear: Tympanic membrane, ear canal and external ear normal.     Left Ear: Tympanic membrane, ear canal and external ear normal.     Nose: Congestion present.     Mouth/Throat:     Mouth: Mucous membranes are moist.     Pharynx: Uvula midline. No oropharyngeal exudate, posterior oropharyngeal erythema or uvula swelling.     Tonsils: No tonsillar exudate or tonsillar abscesses.     Comments: Uvula midline. No abscess, fluctuance, erythema noted in mouth.  No submandibular swelling or tenderness.  No mastoid swelling or tenderness Eyes:     General: No scleral icterus.       Right eye: No discharge.        Left eye: No discharge.     Conjunctiva/sclera: Conjunctivae normal.  Cardiovascular:     Rate and Rhythm: Normal rate.     Pulses: Normal pulses.     Heart  sounds: Normal heart sounds. No murmur heard.    No friction rub. No gallop.  Pulmonary:     Effort: Pulmonary effort is normal. No respiratory distress.     Breath sounds: Normal breath sounds. No stridor. No wheezing or rhonchi.     Comments: Speaking full complete sentences without difficulty.  Maintain oxygen saturation without supplementation Chest:     Chest wall: No tenderness.  Abdominal:     General: There is no distension.     Palpations: Abdomen is soft. There is no mass.     Tenderness: There is no abdominal tenderness. There is no guarding.  Musculoskeletal:     Cervical back: Normal range of motion and neck supple. No rigidity or tenderness.  Lymphadenopathy:     Cervical: No cervical adenopathy.  Skin:    General: Skin is warm.     Capillary  Refill: Capillary refill takes less than 2 seconds.     Coloration: Skin is not jaundiced or pale.  Neurological:     Mental Status: She is alert. Mental status is at baseline.    (all labs ordered are listed, but only abnormal results are displayed) Labs Reviewed  RESP PANEL BY RT-PCR (RSV, FLU A&B, COVID)  [id number]  BASIC METABOLIC PANEL WITH GFR  CBC  HCG, SERUM, QUALITATIVE  TROPONIN I (HIGH SENSITIVITY)  TROPONIN I (HIGH SENSITIVITY)    EKG: None  Radiology: DG Chest 2 View Result Date: 02/27/2024 CLINICAL DATA:  chest pressure EXAM: CHEST - 2 VIEW COMPARISON:  None available. FINDINGS: No focal airspace consolidation, pleural effusion, or pneumothorax. No cardiomegaly.No acute fracture or destructive lesion. IMPRESSION: No acute cardiopulmonary abnormality. Electronically Signed   By: Rogelia Myers M.D.   On: 02/27/2024 11:18    Medications Ordered in the ED  dextromethorphan -guaiFENesin  (MUCINEX  DM) 30-600 MG per 12 hr tablet 1 tablet (1 tablet Oral Given 02/27/24 1554)                                    Medical Decision Making Amount and/or Complexity of Data Reviewed Labs: ordered. Radiology: ordered.  Risk OTC drugs.   Patient presents to the ED for concern of chest pain, palpitations, shortness of breath, congestion, this involves an extensive number of treatment options, and is a complaint that carries with it a high risk of complications and morbidity.  The differential diagnosis includes COVID, flu, RSV, ACS, pneumonia, PE, arrhythmia   Co morbidities that complicate the patient evaluation  THC use, history of palpitations   Additional history obtained:  Additional history obtained from Nursing and Outside Medical Records   External records from outside source obtained and reviewed including triage note, ED note from May 2025   Lab Tests:  I Ordered, and personally interpreted labs.  The pertinent results include:   Troponin WNL No  leukocytosis hCG negative   Imaging Studies ordered:  I ordered imaging studies including chest x-ray I independently visualized and interpreted imaging which showed no acute cardiopulmonary pathology I agree with the radiologist interpretation   Cardiac Monitoring:  The patient was maintained on a cardiac monitor.  I personally viewed and interpreted the cardiac monitored which showed an underlying rhythm of: NSR with no T wave or ST abnormalities   Medicines ordered and prescription drug management:  I ordered medication including mucinex  DM  for congestion  Reevaluation of the patient after these medicines showed that the patient improved I  have reviewed the patients home medicines and have made adjustments as needed    Problem List / ED Course:  Nonproductive cough Congestion No red flags on exam. HEENT exam unremarkable.  Maintaining secretions without difficulty nor drooling Respiratory panel pending.  Did discuss that she can leave and not wait for results.  We will call her if she is positive and she may check this on MyChart Low suspicion for emergent cause of symptoms to include tonsillar abscess, retropharyngeal abscess, [place] angina, mastoiditis Low suspicion for strep with no exudates, posterior oropharyngeal erythema.  Also unlikely as she is having cough Has not tried any OTC medications for symptoms.  Did provide Mucinex  DM in the emergency department Will provide Mucinex  DM prescription to hopefully improve symptoms Discussed symptomatic treatment to include humidifier, plenty of fluids, OTC cough drops Can follow-up with PCP  Chest pain, nonspecific Cardiac workup reassuring.  As symptoms started 2 days ago, do not need additional troponin as it would have been elevated at this time as she had cardiac injury Does not appear ACS in nature.  EKG without ST nor T wave abnormalities Has had palpitations in the past and was seen for this in May 2025.  Low heart  score.  Will have her follow-up with PCP regarding this Discussed decreasing caffeine, THC use to reduce palpitations  Shortness of breath  No shortness of breath with exertion No signs of physical respiratory distress.  Maintaining oxygen saturation without supplementation.  No tachypnea.  Lung sounds CTAB Chest x-ray with no sign of pneumonia nor fluid Does not appear fluid overloaded. No pedal edema nor rales She is PERC negative and I have low suspicion for PE with no risk factors.  No tachycardia nor hypoxia on exam It sounds like shortness of breath comes from feeling congested and current likely viral URI contributing to the symptoms that she has never had orthopnea prior to this Ambulates around the ED with no desaturation.  No signs of physical respiratory distress   Reevaluation:  After the interventions noted above, I reevaluated the patient and found that they have :stayed the same   Social Determinants of Health:  Has PCP Smokes marijuana   Dispostion:  After consideration of the diagnostic results and the patients response to treatment, I feel that the patent would benefit from outpatient management symptomatic care.   Discussed ED workup, disposition, return to ED precautions with patient who expresses understanding agrees with plan.  All questions answered to their satisfaction.  They are agreeable to plan.  Discharge instructions provided on paperwork  Final diagnoses:  Chest pain, unspecified type  Nasal congestion  Nonproductive cough  Dyspnea, unspecified type    ED Discharge Orders          Ordered    dextromethorphan -guaiFENesin  (MUCINEX  DM) 30-600 MG 12hr tablet  2 times daily        02/27/24 1559             Minnie Tinnie BRAVO, PA 02/27/24 1600    Elnor Hila P, DO 02/28/24 1505

## 2024-03-03 ENCOUNTER — Ambulatory Visit (HOSPITAL_COMMUNITY)
Admission: EM | Admit: 2024-03-03 | Discharge: 2024-03-03 | Disposition: A | Discharge status: Home / Self Care | Attending: Family Medicine | Admitting: Family Medicine

## 2024-03-03 ENCOUNTER — Telehealth (HOSPITAL_COMMUNITY): Payer: Self-pay

## 2024-03-03 ENCOUNTER — Encounter (HOSPITAL_COMMUNITY): Payer: Self-pay

## 2024-03-03 ENCOUNTER — Telehealth: Payer: Self-pay | Admitting: Family Medicine

## 2024-03-03 DIAGNOSIS — R42 Dizziness and giddiness: Secondary | ICD-10-CM | POA: Diagnosis not present

## 2024-03-03 DIAGNOSIS — M79601 Pain in right arm: Secondary | ICD-10-CM | POA: Diagnosis present

## 2024-03-03 DIAGNOSIS — Z3202 Encounter for pregnancy test, result negative: Secondary | ICD-10-CM

## 2024-03-03 DIAGNOSIS — M542 Cervicalgia: Secondary | ICD-10-CM | POA: Diagnosis not present

## 2024-03-03 DIAGNOSIS — R82998 Other abnormal findings in urine: Secondary | ICD-10-CM | POA: Diagnosis present

## 2024-03-03 LAB — POCT URINALYSIS DIP (MANUAL ENTRY)
Bilirubin, UA: NEGATIVE
Glucose, UA: NEGATIVE mg/dL
Nitrite, UA: NEGATIVE
Spec Grav, UA: 1.025 (ref 1.010–1.025)
Urobilinogen, UA: 1 U/dL
pH, UA: 6 (ref 5.0–8.0)

## 2024-03-03 LAB — POCT FASTING CBG KUC MANUAL ENTRY: POCT Glucose (KUC): 98 mg/dL (ref 70–99)

## 2024-03-03 LAB — POCT URINE PREGNANCY: Preg Test, Ur: POSITIVE — AB

## 2024-03-03 NOTE — ED Provider Notes (Signed)
 [facility]    CSN: [id number] Arrival date & time: 03/03/24  0830      History   Chief Complaint Chief Complaint  Patient presents with   Neck Pain    HPI Jill Thomas is a 28 y.o. female.    Neck Pain  Patient is here for right sided neck pain and right shoulder pain x  3 days.  It just started randomly.  She has been stressed/panic attacks lately causing muscle tension.   She did take tylenol , BC powder with some help, but then comes back.   She also thinks she may have a UTI  Urine is dark, slight odor.  No painful urination.  No frequency.   She is also having several weeks of light headedness, dizziness, feeling like she cannot breath.  That comes/goes.  It is worse with the arm pain.        Past Medical History:  Diagnosis Date   Headache    Urticaria     Patient Active Problem List   Diagnosis Date Noted   Echogenic intracardiac focus of fetus on prenatal ultrasound 10/23/2017   Supervision of other normal pregnancy, antepartum 08/06/2017    Past Surgical History:  Procedure Laterality Date   NO PAST SURGERIES      OB History     Gravida  3   Para  2   Term  2   Preterm  0   AB  0   Living  2      SAB  0   IAB  0   Ectopic  0   Multiple  0   Live Births  2            Home Medications    Prior to Admission medications   Medication Sig Start Date End Date Taking? Authorizing Provider  fluticasone (FLONASE) 50 MCG/ACT nasal spray Place 2 sprays into both nostrils daily. 11/12/23  Yes [provider]  hydrOXYzine (ATARAX) 25 MG tablet Take 25 mg by mouth every 6 (six) hours as needed. 11/12/23  Yes [provider]  cetirizine  (ZYRTEC ) 10 MG tablet Take one tablet once daily 02/15/23   Jeneal Danita Macintosh, MD  loratadine  (CLARITIN ) 10 MG tablet Take 1 tablet (10 mg total) by mouth daily. Increase to twice daily if needed 01/02/23   Jeneal Danita Macintosh, MD    Family History Family  History  Problem Relation Age of Onset   Cancer Maternal Grandfather        pancreatic   Hypertension Maternal Grandmother     Social History Social History   Tobacco Use   Smoking status: Every Day    Current packs/day: 0.50    Average packs/day: 0.5 packs/day for 5.0 years (2.5 ttl pk-yrs)    Types: Cigarettes   Smokeless tobacco: Never   Tobacco comments:    quit May 2019  Vaping Use   Vaping status: Never Used  Substance Use Topics   Alcohol use: Yes    Comment: occasionaly   Drug use: No     Allergies   Amoxicillin   Review of Systems Review of Systems  Constitutional: Negative.   HENT: Negative.    Respiratory: Negative.    Cardiovascular: Negative.   Gastrointestinal: Negative.   Musculoskeletal:  Positive for neck pain.  Neurological:  Positive for dizziness.  Psychiatric/Behavioral:  The patient is nervous/anxious.      Physical Exam Triage Vital Signs ED Triage Vitals  Encounter Vitals Group  BP 03/03/24 0901 124/84     Girls Systolic BP Percentile --      Girls Diastolic BP Percentile --      Boys Systolic BP Percentile --      Boys Diastolic BP Percentile --      Pulse Rate 03/03/24 0901 72     Resp 03/03/24 0901 16     Temp 03/03/24 0901 98.3 F (36.8 C)     Temp Source 03/03/24 0901 Oral     SpO2 03/03/24 0901 98 %     Weight --      Height --      Head Circumference --      Peak Flow --      Pain Score 03/03/24 0902 7     Pain Loc --      Pain Education --      Exclude from Growth Chart --    No data found.  Updated Vital Signs BP 124/84 (BP Location: Left Arm)   Pulse 72   Temp 98.3 F (36.8 C) (Oral)   Resp 16   LMP 02/23/2024 (Exact Date)   SpO2 98%   Breastfeeding No   Visual Acuity Right Eye Distance:   Left Eye Distance:   Bilateral Distance:    Right Eye Near:   Left Eye Near:    Bilateral Near:     Physical Exam Constitutional:      General: She is not in acute distress.    Appearance: Normal  appearance. She is normal weight. She is not ill-appearing or toxic-appearing.  HENT:     Nose: Nose normal.     Mouth/Throat:     Mouth: Mucous membranes are moist.  Eyes:     General: Lids are normal.     Extraocular Movements: Extraocular movements intact.     Conjunctiva/sclera: Conjunctivae normal.  Cardiovascular:     Rate and Rhythm: Normal rate and regular rhythm.  Pulmonary:     Effort: Pulmonary effort is normal.     Breath sounds: Normal breath sounds.  Musculoskeletal:     Cervical back: Neck supple. No tenderness.     Comments: No spinous tenderness;  + ttp to the right posterior neck;  full rom of the neck without limitation. Full rom of the right arm;  normal strength noted;  no TTP noted  Lymphadenopathy:     Cervical: No cervical adenopathy.  Neurological:     General: No focal deficit present.     Mental Status: She is alert.  Psychiatric:        Mood and Affect: Mood normal.      UC Treatments / Results  Labs (all labs ordered are listed, but only abnormal results are displayed) Labs Reviewed  POCT URINALYSIS DIP (MANUAL ENTRY) - Abnormal; Notable for the following components:      Result Value   Clarity, UA hazy (*)    Ketones, POC UA small (15) (*)    Blood, UA trace-intact (*)    Protein Ur, POC trace (*)    Leukocytes, UA Small (1+) (*)    All other components within normal limits  POCT URINE PREGNANCY - Abnormal; Notable for the following components:   Preg Test, Ur Positive (*)    All other components within normal limits  POCT FASTING CBG KUC MANUAL ENTRY   UPT positive  EKG   Radiology No results found.  Procedures Procedures (including critical care time)  Medications Ordered in UC Medications - No data to display  Initial Impression / Assessment and Plan / UC Course  I have reviewed the triage vital signs and the nursing notes.  Pertinent labs & imaging results that were available during my care of the patient were reviewed  by me and considered in my medical decision making (see chart for details).   Final Clinical Impressions(s) / UC Diagnoses   Final diagnoses:  Dizziness  Dark urine  Neck pain  Right arm pain     Discharge Instructions      You were seen today for dizziness, urinary symptoms and arm/neck pain.  Your pregnancy test was positive here today.  I recommend you see your ob/gyn for further care.   I will send your urine to the lab for further testing.  If there is any bacteria that needs to be treated you will be notified.  As a result of pregnancy, you can only take tylenol  for the neck/arm pain at this time.  You can use a heating pad as well.      ED Prescriptions   None    PDMP not reviewed this encounter.   Darral Longs, MD 03/03/24 1009

## 2024-03-03 NOTE — Telephone Encounter (Signed)
 Patient called to schedule prenatal appts. She was seen in Urgent care today. She says her LMP is 02/23/24. She is interested in getting a dating US  prior to her intake appointment that has been scheduled for 10/28 due to her recent LMP.

## 2024-03-03 NOTE — Telephone Encounter (Signed)
 Returning pts call. Attempted to reach patient x 1, unable to LVM. Mailbox is full.

## 2024-03-03 NOTE — ED Triage Notes (Signed)
 Patient here today with c/o right side neck pain that radiates down to right shoulder X 3 days. No known injury. Patient states that she has also been feeling hot and her hands have been burning. She tried taking BC powder and Tylenol  with little relief.

## 2024-03-03 NOTE — ED Notes (Signed)
 Unable to provide urine sample at this time. PO fluids provided.

## 2024-03-03 NOTE — Discharge Instructions (Signed)
 You were seen today for dizziness, urinary symptoms and arm/neck pain.  Your pregnancy test was positive here today.  I recommend you see your ob/gyn for further care.   I will send your urine to the lab for further testing.  If there is any bacteria that needs to be treated you will be notified.  As a result of pregnancy, you can only take tylenol  for the neck/arm pain at this time.  You can use a heating pad as well.

## 2024-03-04 ENCOUNTER — Ambulatory Visit (HOSPITAL_COMMUNITY): Payer: Self-pay

## 2024-03-04 LAB — URINE CULTURE

## 2024-03-04 NOTE — Telephone Encounter (Signed)
 Attempt to call patient. No answer and LVM for call back.   Will send a MyChart Message to patient.   Ermalinda GRADE CMA

## 2024-03-05 ENCOUNTER — Ambulatory Visit (HOSPITAL_COMMUNITY)
Admission: EM | Admit: 2024-03-05 | Discharge: 2024-03-05 | Disposition: A | Discharge status: Home / Self Care | Attending: Family Medicine | Admitting: Family Medicine

## 2024-03-05 DIAGNOSIS — R300 Dysuria: Secondary | ICD-10-CM | POA: Diagnosis present

## 2024-03-05 NOTE — ED Triage Notes (Signed)
 Pt here for urine recollect.

## 2024-03-06 ENCOUNTER — Inpatient Hospital Stay (HOSPITAL_COMMUNITY)
Admission: AD | Admit: 2024-03-06 | Discharge: 2024-03-06 | Disposition: A | Discharge status: Home / Self Care | Attending: Obstetrics and Gynecology | Admitting: Obstetrics and Gynecology

## 2024-03-06 ENCOUNTER — Other Ambulatory Visit: Payer: Self-pay

## 2024-03-06 DIAGNOSIS — N390 Urinary tract infection, site not specified: Secondary | ICD-10-CM | POA: Insufficient documentation

## 2024-03-06 DIAGNOSIS — R1030 Lower abdominal pain, unspecified: Secondary | ICD-10-CM | POA: Diagnosis present

## 2024-03-06 DIAGNOSIS — N3000 Acute cystitis without hematuria: Secondary | ICD-10-CM

## 2024-03-06 LAB — CBC
HCT: 40.6 % (ref 36.0–46.0)
Hemoglobin: 13.3 g/dL (ref 12.0–15.0)
MCH: 29.2 pg (ref 26.0–34.0)
MCHC: 32.8 g/dL (ref 30.0–36.0)
MCV: 89.2 fL (ref 80.0–100.0)
Platelets: 301 K/uL (ref 150–400)
RBC: 4.55 MIL/uL (ref 3.87–5.11)
RDW: 13.4 % (ref 11.5–15.5)
WBC: 7.5 K/uL (ref 4.0–10.5)
nRBC: 0 % (ref 0.0–0.2)

## 2024-03-06 LAB — WET PREP, GENITAL
Clue Cells Wet Prep HPF POC: NONE SEEN
Sperm: NONE SEEN
Trich, Wet Prep: NONE SEEN
WBC, Wet Prep HPF POC: >=10 — AB (ref <10)
Yeast Wet Prep HPF POC: NONE SEEN

## 2024-03-06 LAB — HCG, QUANTITATIVE, PREGNANCY: hCG, Beta Chain, Quant, S: <1 m[IU]/mL (ref <5)

## 2024-03-06 MED ORDER — CEFADROXIL 500 MG PO CAPS: 500.0000 mg | ORAL_CAPSULE | Freq: Two times a day (BID) | ORAL | 0 refills | Status: AC

## 2024-03-06 NOTE — MAU Note (Signed)
 Jill Thomas is a 28 y.o. at Unknown here in MAU reporting: she has intermittent lower abdominal cramping that began on Monday.  Denies VB.  States seen at Urgent Care, told had UTI unsure if cramping due to UTI  LMP: 02/23/2024 Onset of complaint: Monday Pain score: 7 Vitals:   03/06/24 0943  BP: 125/79  Pulse: 65  Resp: 18  Temp: 97.9 F (36.6 C)  SpO2: 100%     FHT: NA  Lab orders placed from triage: UA

## 2024-03-06 NOTE — MAU Provider Note (Signed)
 History     CSN: [id number]  Arrival date and time: 03/06/24 0854   None     Chief Complaint  Patient presents with   Abdominal Pain   HPI Patient presenting for abdominal pain over the last few days.  Reports she had a negative pregnancy test on 8/20 and a positive pregnancy test on 8/25 at urgent care.  Reports there was concern for UTI but she does not know the results from the testing.  Abdominal pain has continued.  OB History     Gravida  3   Para  2   Term  2   Preterm  0   AB  0   Living  2      SAB  0   IAB  0   Ectopic  0   Multiple  0   Live Births  2           Past Medical History:  Diagnosis Date   Headache    Urticaria     Past Surgical History:  Procedure Laterality Date   NO PAST SURGERIES      Family History  Problem Relation Age of Onset   Cancer Maternal Grandfather        pancreatic   Hypertension Maternal Grandmother     Social History   Tobacco Use   Smoking status: Every Day    Current packs/day: 0.50    Average packs/day: 0.5 packs/day for 5.0 years (2.5 ttl pk-yrs)    Types: Cigarettes   Smokeless tobacco: Never   Tobacco comments:    quit May 2019  Vaping Use   Vaping status: Never Used  Substance Use Topics   Alcohol use: Yes    Comment: occasionaly   Drug use: No    Allergies:  Allergies  Allergen Reactions   Amoxicillin Dermatitis    Facility-Administered Medications Prior to Admission  Medication Dose Route Frequency Provider Last Rate Last Admin   cetirizine  (ZYRTEC ) tablet 10 mg  10 mg Oral Daily Degele, Julie P, MD       Medications Prior to Admission  Medication Sig Dispense Refill Last Dose/Taking   cetirizine  (ZYRTEC ) 10 MG tablet Take one tablet once daily 30 tablet 2    fluticasone (FLONASE) 50 MCG/ACT nasal spray Place 2 sprays into both nostrils daily.      hydrOXYzine (ATARAX) 25 MG tablet Take 25 mg by mouth every 6 (six) hours as needed.      loratadine  (CLARITIN ) 10 MG tablet  Take 1 tablet (10 mg total) by mouth daily. Increase to twice daily if needed 60 tablet 5     Review of Systems  Constitutional:  Negative for fever.  Gastrointestinal:  Positive for abdominal pain.  All other systems reviewed and are negative.  Physical Exam   Blood pressure 125/79, pulse 65, temperature 97.9 F (36.6 C), temperature source Oral, resp. rate 18, height 5' (1.524 m), weight 78 kg, last menstrual period 02/23/2024, SpO2 100%.  Physical Exam Vitals and nursing note reviewed.  Constitutional:      Appearance: Normal appearance.  HENT:     Head: Normocephalic and atraumatic.     Nose: No congestion or rhinorrhea.  Eyes:     Extraocular Movements: Extraocular movements intact.  Cardiovascular:     Rate and Rhythm: Normal rate.  Pulmonary:     Effort: Pulmonary effort is normal.  Abdominal:     Palpations: Abdomen is soft.     Tenderness: There is no abdominal tenderness.  Musculoskeletal:        General: Normal range of motion.     Cervical back: Normal range of motion.  Skin:    General: Skin is warm.     Capillary Refill: Capillary refill takes less than 2 seconds.  Neurological:     General: No focal deficit present.     Mental Status: She is alert.     Cranial Nerves: No cranial nerve deficit.  Psychiatric:        Mood and Affect: Mood normal.        Behavior: Behavior normal.     MAU Course  Procedures  MDM CBC hCG quant Wet prep   Assessment and Plan  Jill Thomas is a 28 year old female presenting for abdominal pain  Abdominal pain Patient reports positive pregnancy test on 8/25 which is visible on epic.  There was concern for UTI at the time but she did not receive treatment.  Urine culture was sent.  hCG quant today was less than 1.  Possible false positive urine pregnancy test at the urgent care.  Urine culture did come back growing gram-negative rods.  Will treat UTI with Duricef twice daily for 7 days.  Discussed return  precautions.  No further questions or concerns.  Patient discharged home.  Kyilee Gregg V Arron Tetrault 03/06/2024, 1:10 PM

## 2024-03-06 NOTE — Discharge Instructions (Signed)
 It was a pleasure taking care of you today.  I am sorry to tell you that your pregnancy test from the urgent care was likely a false negative.  We collected blood and looked at the hCG levels and they were less than 1 which indicates you are not pregnant.  Your urine culture did show you have a UTI so I need to treat that.  I sent a prescription for a medication called Duricef to your pharmacy which she will take twice daily for 7 days.  If your abdominal pain worsens please return here or the main emergency department for further evaluation.  I hope you have a good rest of your day.

## 2024-03-07 ENCOUNTER — Ambulatory Visit (HOSPITAL_COMMUNITY): Payer: Self-pay

## 2024-03-07 ENCOUNTER — Telehealth: Payer: Self-pay

## 2024-03-07 LAB — GC/CHLAMYDIA PROBE AMP (~~LOC~~) NOT AT ARMC
Chlamydia: NEGATIVE
Comment: NEGATIVE
Comment: NORMAL
Neisseria Gonorrhea: NEGATIVE

## 2024-03-07 LAB — URINE CULTURE: Culture: >=100000 — AB

## 2024-03-07 NOTE — Telephone Encounter (Signed)
 Patient checking on prescription, she did not see where is was it was sent to [facility] on [address], Told the patient

## 2024-05-06 ENCOUNTER — Telehealth
